# Patient Record
Sex: Female | Born: 1975 | Race: White | Hispanic: No | Smoking: Never smoker
Health system: Southern US, Community
[De-identification: ages and names within clinical notes are randomized; demographics above are authoritative.]

## PROBLEM LIST (undated history)

## (undated) DIAGNOSIS — T7840XA Allergy, unspecified, initial encounter: Secondary | ICD-10-CM

## (undated) DIAGNOSIS — K589 Irritable bowel syndrome without diarrhea: Secondary | ICD-10-CM

## (undated) DIAGNOSIS — K219 Gastro-esophageal reflux disease without esophagitis: Secondary | ICD-10-CM

## (undated) DIAGNOSIS — J45909 Unspecified asthma, uncomplicated: Secondary | ICD-10-CM

## (undated) DIAGNOSIS — F419 Anxiety disorder, unspecified: Secondary | ICD-10-CM

## (undated) DIAGNOSIS — B019 Varicella without complication: Secondary | ICD-10-CM

## (undated) HISTORY — DX: Gastro-esophageal reflux disease without esophagitis: K21.9

## (undated) HISTORY — DX: Allergy, unspecified, initial encounter: T78.40XA

## (undated) HISTORY — DX: Unspecified asthma, uncomplicated: J45.909

## (undated) HISTORY — DX: Varicella without complication: B01.9

## (undated) HISTORY — PX: FOOT SURGERY: SHX648

## (undated) HISTORY — DX: Anxiety disorder, unspecified: F41.9

## (undated) HISTORY — DX: Irritable bowel syndrome, unspecified: K58.9

---

## 2000-09-28 ENCOUNTER — Other Ambulatory Visit: Admission: RE | Admit: 2000-09-28 | Discharge: 2000-09-28 | Payer: Self-pay | Admitting: Obstetrics and Gynecology

## 2002-01-01 ENCOUNTER — Other Ambulatory Visit: Admission: RE | Admit: 2002-01-01 | Discharge: 2002-01-01 | Payer: Self-pay | Admitting: Obstetrics and Gynecology

## 2003-01-23 ENCOUNTER — Other Ambulatory Visit: Admission: RE | Admit: 2003-01-23 | Discharge: 2003-01-23 | Payer: Self-pay | Admitting: Obstetrics and Gynecology

## 2003-11-05 ENCOUNTER — Inpatient Hospital Stay (HOSPITAL_COMMUNITY): Admission: AD | Admit: 2003-11-05 | Discharge: 2003-11-05 | Payer: Self-pay | Admitting: Obstetrics and Gynecology

## 2003-12-03 ENCOUNTER — Inpatient Hospital Stay (HOSPITAL_COMMUNITY): Admission: AD | Admit: 2003-12-03 | Discharge: 2003-12-05 | Payer: Self-pay | Admitting: Obstetrics and Gynecology

## 2004-01-08 ENCOUNTER — Other Ambulatory Visit: Admission: RE | Admit: 2004-01-08 | Discharge: 2004-01-08 | Payer: Self-pay | Admitting: Obstetrics and Gynecology

## 2009-01-26 ENCOUNTER — Emergency Department (HOSPITAL_COMMUNITY): Admission: EM | Admit: 2009-01-26 | Discharge: 2009-01-26 | Payer: Self-pay | Admitting: Family Medicine

## 2011-10-04 DIAGNOSIS — K219 Gastro-esophageal reflux disease without esophagitis: Secondary | ICD-10-CM | POA: Insufficient documentation

## 2011-11-22 DIAGNOSIS — K589 Irritable bowel syndrome without diarrhea: Secondary | ICD-10-CM | POA: Insufficient documentation

## 2013-10-25 DIAGNOSIS — J189 Pneumonia, unspecified organism: Secondary | ICD-10-CM

## 2013-10-25 HISTORY — DX: Pneumonia, unspecified organism: J18.9

## 2015-06-19 ENCOUNTER — Encounter: Payer: Self-pay | Admitting: Family Medicine

## 2015-06-19 ENCOUNTER — Ambulatory Visit (INDEPENDENT_AMBULATORY_CARE_PROVIDER_SITE_OTHER): Payer: BLUE CROSS/BLUE SHIELD | Admitting: Family Medicine

## 2015-06-19 VITALS — BP 118/70 | HR 64 | Ht 66.0 in | Wt 109.0 lb

## 2015-06-19 DIAGNOSIS — M9908 Segmental and somatic dysfunction of rib cage: Secondary | ICD-10-CM

## 2015-06-19 DIAGNOSIS — M9902 Segmental and somatic dysfunction of thoracic region: Secondary | ICD-10-CM | POA: Diagnosis not present

## 2015-06-19 DIAGNOSIS — M9901 Segmental and somatic dysfunction of cervical region: Secondary | ICD-10-CM

## 2015-06-19 DIAGNOSIS — M940 Chondrocostal junction syndrome [Tietze]: Secondary | ICD-10-CM | POA: Diagnosis not present

## 2015-06-19 DIAGNOSIS — M999 Biomechanical lesion, unspecified: Secondary | ICD-10-CM | POA: Insufficient documentation

## 2015-06-19 NOTE — Progress Notes (Signed)
[name] [address] [place], [place] [address] Phone: [phone] Subjective:     CC: Right rib pain  BJY:NWGNFAOZHY Jaclyn [name] is a 39 y.o. female coming in with complaint of right rib pain. Patient is a past medical history significant for asthma. Patient did have another exacerbation and was having a significant cough. Patient was treated for pneumonia as well. Patient states though that since then she's been having pain on the right side. Patient has tried a [place] with no significant improvement. States sometimes it can catch her breath. Hurts with certain movements. Denies any numbness or tingling. Denies any fevers chills or any abnormal weight loss. Patient rates the severity of pain a 6 out of 10. Does respond anti-inflammatory patient is now when he uses regularly. Looking for a more concrete answer as well as what she can do. Not waking her up at night.     No past medical history on file. asthma No past surgical history on file. shoulder dislocation surgery Social History  Substance Use Topics  . Smoking status: Never Smoker   . Smokeless tobacco: None  . Alcohol Use: None   Social History   Social History  . Marital Status: Legally Separated    Spouse Name: N/A  . Number of Children: N/A  . Years of Education: N/A   Occupational History  . Not on file.   Social History Main Topics  . Smoking status: Never Smoker   . Smokeless tobacco: Not on file  . Alcohol Use: Not on file  . Drug Use: Not on file  . Sexual Activity: Not on file   Other Topics Concern  . Not on file   Social History Narrative  . No narrative on file   No family history on file.   Past medical history, social, surgical and family history all reviewed in electronic medical record.   Review of Systems: No headache, visual changes, nausea, vomiting, diarrhea, constipation, dizziness, abdominal pain, skin rash, fevers, chills, night sweats,  weight loss, swollen lymph nodes, body aches, joint swelling, muscle aches, chest pain, shortness of breath, mood changes.   Objective Blood pressure 118/70, pulse 64, height 5\' 6"  (1.676 m), weight 109 lb (49.442 kg), SpO2 97 %.  General: No apparent distress alert and oriented x3 mood and affect normal, dressed appropriately.  HEENT: Pupils equal, extraocular movements intact  Respiratory: Patient's speak in full sentences and does not appear short of breath  Cardiovascular: No lower extremity edema, non tender, no erythema  Skin: Warm dry intact with no signs of infection or rash on extremities or on axial skeleton.  Abdomen: Soft nontender  Neuro: Cranial nerves II through XII are intact, neurovascularly intact in all extremities with 2+ DTRs and 2+ pulses.  Lymph: No lymphadenopathy of posterior or anterior cervical chain or axillae bilaterally.  Gait normal with good balance and coordination.  MSK:  Non tender with full range of motion and good stability and symmetric strength and tone of shoulders, elbows, wrist, hip, knee and ankles bilaterally.  Back Exam:  Inspection: Unremarkable  Motion: Flexion 45 deg, Extension 45 deg, Side Bending to 45 deg bilaterally,  Rotation to 45 deg bilaterally  SLR laying: Negative  XSLR laying: Negative  Palpable tenderness: Tender over the fifth rib on the right side FABER: negative. Sensory change: Gross sensation intact to all lumbar and sacral dermatomes.  Reflexes: 2+ at both patellar tendons, 2+ at achilles tendons, Babinski's downgoing.  Strength at foot  Plantar-flexion: 5/5 Dorsi-flexion: 5/5 Eversion: 5/5 Inversion: 5/5  Leg strength  Quad: 5/5 Hamstring: 5/5 Hip flexor: 5/5 Hip abductors: 5/5  Gait unremarkable.  Osteopathic findings C4 flexed rotated and side bent left T5 extended rotated and side bent right with inhaled fifth rib L2 flexed rotated and side bent left  Procedure note 97110; 15 minutes spent for Therapeutic  exercises as stated in above notes.  This included exercises focusing on stretching, strengthening, with significant focus on eccentric aspects.Basic scapular stabilization to include adduction and depression of scapula Scaption, focusing on proper movement and good control Internal and External rotation utilizing a theraband, with elbow tucked at side entire time Rows with theraband   Proper technique shown and discussed handout in great detail with ATC.  All questions were discussed and answered.      Impression and Recommendations:     This case required medical decision making of moderate complexity.

## 2015-06-19 NOTE — Progress Notes (Signed)
Pre visit review using our clinic review tool, if applicable. No additional management support is needed unless otherwise documented below in the visit note. 

## 2015-06-19 NOTE — Assessment & Plan Note (Signed)
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 We discussed home exercises, scapular stabilization, icing put a call, and over-the-counter natural supplementations. We discussed which activities to do an proper lifting mechanics. Patient will try to make these different changes and come back in 3-4 weeks for further evaluation. Imaging is not necessary at this time.

## 2015-06-19 NOTE — Patient Instructions (Addendum)
Good to see you.  Ice 20 minutes 2 times daily. Usually after activity and before bed. Exercises 3 times a week. Focus on scapula stabilization Vitamin D 2000 IU daily Consider turmeric  twice daily Tennis ball to area and lay on it can take a weight and localize force and sometimes get it to pop back in.  Proper lifting techniques even your coffee pennsaid pinkie amount topically 2 times daily as needed.  See me again in 3 weeks.   Standing:  Secure a rubber exercise band/tubing so that it is at the height of your shoulders when you are either standing or sitting on a firm arm-less chair.  Grasp an end of the band/tubing in each hand and have your palms face each other. Straighten your elbows and lift your hands straight in front of you at shoulder height. Step back away from the secured end of band/tubing until it becomes tense.  Squeeze your shoulder blades together. Keeping your elbows locked and your hands at shoulder-height, bring your hands out to your side.  Hold __________ seconds. Slowly ease the tension on the band/tubing as you reverse the directions and return to the starting position. Repeat __________ times. Complete this exercise __________ times per day. STRENGTH - Scapular Retractors  Secure a rubber exercise band/tubing so that it is at the height of your shoulders when you are either standing or sitting on a firm arm-less chair.  With a palm-down grip, grasp an end of the band/tubing in each hand. Straighten your elbows and lift your hands straight in front of you at shoulder height. Step back away from the secured end of band/tubing until it becomes tense.  Squeezing your shoulder blades together, draw your elbows back as you bend them. Keep your upper arm lifted away from your body throughout the exercise.  Hold __________ seconds. Slowly ease the tension on the band/tubing as you reverse the directions and return to the starting position. Repeat __________  times. Complete this exercise __________ times per day. STRENGTH - Shoulder Extensors   Secure a rubber exercise band/tubing so that it is at the height of your shoulders when you are either standing or sitting on a firm arm-less chair.  With a thumbs-up grip, grasp an end of the band/tubing in each hand. Straighten your elbows and lift your hands straight in front of you at shoulder height. Step back away from the secured end of band/tubing until it becomes tense.  Squeezing your shoulder blades together, pull your hands down to the sides of your thighs. Do not allow your hands to go behind you.  Hold for __________ seconds. Slowly ease the tension on the band/tubing as you reverse the directions and return to the starting position. Repeat __________ times. Complete this exercise __________ times per day.  STRENGTH - Scapular Retractors and External Rotators  Secure a rubber exercise band/tubing so that it is at the height of your shoulders when you are either standing or sitting on a firm arm-less chair.  With a palm-down grip, grasp an end of the band/tubing in each hand. Bend your elbows 90 degrees and lift your elbows to shoulder height at your sides. Step back away from the secured end of band/tubing until it becomes tense.  Squeezing your shoulder blades together, rotate your shoulder so that your upper arm and elbow remain stationary, but your fists travel upward to head-height.  Hold __________ for seconds. Slowly ease the tension on the band/tubing as you reverse the directions and return  to the starting position. Repeat __________ times. Complete this exercise __________ times per day.  STRENGTH - Scapular Retractors and External Rotators, Rowing  Secure a rubber exercise band/tubing so that it is at the height of your shoulders when you are either standing or sitting on a firm arm-less chair.  With a palm-down grip, grasp an end of the band/tubing in each hand. Straighten your  elbows and lift your hands straight in front of you at shoulder height. Step back away from the secured end of band/tubing until it becomes tense.  Step 1: Squeeze your shoulder blades together. Bending your elbows, draw your hands to your chest as if you are rowing a boat. At the end of this motion, your hands and elbow should be at shoulder-height and your elbows should be out to your sides.  Step 2: Rotate your shoulder to raise your hands above your head. Your forearms should be vertical and your upper-arms should be horizontal.  Hold for __________ seconds. Slowly ease the tension on the band/tubing as you reverse the directions and return to the starting position. Repeat __________ times. Complete this exercise __________ times per day.  STRENGTH - Scapular Retractors and Elevators  Secure a rubber exercise band/tubing so that it is at the height of your shoulders when you are either standing or sitting on a firm arm-less chair.  With a thumbs-up grip, grasp an end of the band/tubing in each hand. Step back away from the secured end of band/tubing until it becomes tense.  Squeezing your shoulder blades together, straighten your elbows and lift your hands straight over your head.  Hold for __________ seconds. Slowly ease the tension on the band/tubing as you reverse the directions and return to the starting position. Repeat __________ times. Complete this exercise __________ times per day.  Document Released: 10/11/2005 Document Revised: 01/03/2012 Document Reviewed: 01/23/2009 [facility] Patient Information 2015 [place], [place]. This information is not intended to replace advice given to you by your health care provider. Make sure you discuss any questions you have with your health care provider.

## 2015-06-19 NOTE — Assessment & Plan Note (Signed)
Decision today to treat with OMT was based on Physical Exam  After verbal consent patient was treated with HVLA, ME, FPR techniques in cervical, thoracic and rib areas  Patient tolerated the procedure well with improvement in symptoms  Patient given exercises, stretches and lifestyle modifications  See medications in patient instructions if given  Patient will follow up in 3 weeks  

## 2015-06-24 ENCOUNTER — Telehealth: Payer: Self-pay | Admitting: Family Medicine

## 2015-06-24 ENCOUNTER — Telehealth: Payer: Self-pay

## 2015-06-24 NOTE — Telephone Encounter (Signed)
Patient has some questions about the exercises that was given to her  Please call her at [phone] after 2:45

## 2015-06-24 NOTE — Telephone Encounter (Signed)
Spoke with patient regarding limitations, wanted to start doing basic pelvic floor activation and walking and wanted to make sure they should be ok

## 2015-07-10 ENCOUNTER — Ambulatory Visit (INDEPENDENT_AMBULATORY_CARE_PROVIDER_SITE_OTHER): Payer: BLUE CROSS/BLUE SHIELD | Admitting: Family Medicine

## 2015-07-10 ENCOUNTER — Encounter: Payer: Self-pay | Admitting: Family Medicine

## 2015-07-10 ENCOUNTER — Other Ambulatory Visit (INDEPENDENT_AMBULATORY_CARE_PROVIDER_SITE_OTHER): Payer: BLUE CROSS/BLUE SHIELD

## 2015-07-10 VITALS — BP 100/62 | HR 66 | Ht 66.0 in | Wt 108.0 lb

## 2015-07-10 DIAGNOSIS — M9908 Segmental and somatic dysfunction of rib cage: Secondary | ICD-10-CM | POA: Diagnosis not present

## 2015-07-10 DIAGNOSIS — M999 Biomechanical lesion, unspecified: Secondary | ICD-10-CM

## 2015-07-10 DIAGNOSIS — M9902 Segmental and somatic dysfunction of thoracic region: Secondary | ICD-10-CM

## 2015-07-10 DIAGNOSIS — M25511 Pain in right shoulder: Secondary | ICD-10-CM

## 2015-07-10 DIAGNOSIS — M9901 Segmental and somatic dysfunction of cervical region: Secondary | ICD-10-CM | POA: Diagnosis not present

## 2015-07-10 DIAGNOSIS — M940 Chondrocostal junction syndrome [Tietze]: Secondary | ICD-10-CM | POA: Diagnosis not present

## 2015-07-10 MED ORDER — NITROGLYCERIN 0.2 MG/HR TD PT24: MEDICATED_PATCH | TRANSDERMAL | Status: DC

## 2015-07-10 NOTE — Patient Instructions (Addendum)
Good to see you Try the exercises until you have pain but stop if you have pain Try the pelvis rehab.  Nitroglycerin Protocol   Apply 1/4 nitroglycerin patch to affected area daily.  Change position of patch within the affected area every 24 hours.  You may experience a headache during the first 1-2 weeks of using the patch, these should subside.  If you experience headaches after beginning nitroglycerin patch treatment, you may take your preferred over the counter pain reliever.  Another side effect of the nitroglycerin patch is skin irritation or rash related to patch adhesive.  Please notify our office if you develop more severe headaches or rash, and stop the patch.  Tendon healing with nitroglycerin patch may require 12 to 24 weeks depending on the extent of injury.  Men should not use if taking Viagra, Cialis, or Levitra.   Do not use if you have migraines or rosacea.   Continue the ice Continue the vitamins but increase vitamin D to 4000 IU daily for next 2 weeks See me again in 4 weeks.

## 2015-07-10 NOTE — Progress Notes (Signed)
Pre visit review using our clinic review tool, if applicable. No additional management support is needed unless otherwise documented below in the visit note. 

## 2015-07-10 NOTE — Assessment & Plan Note (Signed)
Patient is healing but it is very slow. I do think that there is some scar tissue formation and patient elected to try something more aggressive and will try and nitroglycerin. Warned of potential side effects. Patient will stop it if she has any of the side effects. We discussed icing regimen and home exercises. Patient continue work on the postural control. Patient come back and see me again in 4-6 weeks for further evaluation and treatment.

## 2015-07-10 NOTE — Progress Notes (Signed)
[name] [address] [place], [place] [address] Phone: [phone] Subjective:     CC: Right rib pain follow-up  BJY:NWGNFAOZHY Jaclyn [name] is a 39 y.o. female coming in with complaint of right rib pain. 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 Patient didn't tolerate osteopathic manipulation and given home exercises working on scapular stabilization and upper back musculature. Patient was to do some over-the-counter natural supplementation. Patient was also given an icing protocol. Patient states she is doing better but still has some tightness in the area. Patient feels that the rib stated for a long amount of time but unfortunately feels like she has more scarring sensation on the anterior part of the rib. Patient states is not stopping her from any significant activity but still not lifting. States that some of the exercises causes pain.  No numbness or weakness. Overall would state that she's 50-60% better.      Past Medical History  Diagnosis Date  . Asthma   . Chicken pox   . GERD (gastroesophageal reflux disease)   . Allergy    asthma History reviewed. No pertinent past surgical history. shoulder dislocation surgery Social History  Substance Use Topics  . Smoking status: Never Smoker   . Smokeless tobacco: Never Used  . Alcohol Use: No   Social History   Social History  . Marital Status: Legally Separated    Spouse Name: N/A  . Number of Children: N/A  . Years of Education: N/A   Occupational History  . Not on file.   Social History Main Topics  . Smoking status: Never Smoker   . Smokeless tobacco: Never Used  . Alcohol Use: No  . Drug Use: No  . Sexual Activity: Not on file   Other Topics Concern  . Not on file   Social History Narrative   Family History  Problem Relation Age of Onset  . Alcohol abuse Mother   . Cancer Mother   . Heart disease Mother   . Alcohol abuse Father   . Heart disease  Father      Past medical history, social, surgical and family history all reviewed in electronic medical record.   Review of Systems: No headache, visual changes, nausea, vomiting, diarrhea, constipation, dizziness, abdominal pain, skin rash, fevers, chills, night sweats, weight loss, swollen lymph nodes, body aches, joint swelling, muscle aches, chest pain, shortness of breath, mood changes.   Objective Blood pressure 100/62, pulse 66, height  (1.676 m), weight 108 lb (48.988 kg), SpO2 98 %.  General: No apparent distress alert and oriented x3 mood and affect normal, dressed appropriately.  HEENT: Pupils equal, extraocular movements intact  Respiratory: Patient's speak in full sentences and does not appear short of breath  Cardiovascular: No lower extremity edema, non tender, no erythema  Skin: Warm dry intact with no signs of infection or rash on extremities or on axial skeleton.  Abdomen: Soft nontender  Neuro: Cranial nerves II through XII are intact, neurovascularly intact in all extremities with 2+ DTRs and 2+ pulses.  Lymph: No lymphadenopathy of posterior or anterior cervical chain or axillae bilaterally.  Gait normal with good balance and coordination.  MSK:  Non tender with full range of motion and good stability and symmetric strength and tone of shoulders, elbows, wrist, hip, knee and ankles bilaterally.  Back Exam:  Inspection: Unremarkable  Motion: Flexion 45 deg, Extension 45 deg, Side Bending to 45 deg bilaterally,  Rotation to  45 deg bilaterally  SLR laying: Negative  XSLR laying: Negative  Palpable tenderness: Tender over the fifth rib on the right side still present FABER: negative. Sensory change: Gross sensation intact to all lumbar and sacral dermatomes.  Reflexes: 2+ at both patellar tendons, 2+ at achilles tendons, Babinski's downgoing.  Strength at foot  Plantar-flexion: 5/5 Dorsi-flexion: 5/5 Eversion: 5/5 Inversion: 5/5  Leg strength  Quad: 5/5  Hamstring: 5/5 Hip flexor: 5/5 Hip abductors: 4/5  Gait unremarkable.  Osteopathic findings C4 flexed rotated and side bent left T5 extended rotated and side bent right with inhaled fifth rib L2 flexed rotated and side bent left L4 flexed rotated and side bent right       Impression and Recommendations:     This case required medical decision making of moderate complexity.

## 2015-07-10 NOTE — Assessment & Plan Note (Signed)
Decision today to treat with OMT was based on Physical Exam  After verbal consent patient was treated with HVLA, ME, FPR techniques in cervical, thoracic and rib areas  Patient tolerated the procedure well with improvement in symptoms  Patient given exercises, stretches and lifestyle modifications  See medications in patient instructions if given  Patient will follow up in 4-6 weeks    

## 2015-08-06 ENCOUNTER — Ambulatory Visit (INDEPENDENT_AMBULATORY_CARE_PROVIDER_SITE_OTHER): Payer: BLUE CROSS/BLUE SHIELD | Admitting: Family Medicine

## 2015-08-06 ENCOUNTER — Encounter: Payer: Self-pay | Admitting: Family Medicine

## 2015-08-06 VITALS — BP 112/70 | HR 64 | Ht 66.0 in | Wt 107.0 lb

## 2015-08-06 DIAGNOSIS — M940 Chondrocostal junction syndrome [Tietze]: Secondary | ICD-10-CM

## 2015-08-06 DIAGNOSIS — M9902 Segmental and somatic dysfunction of thoracic region: Secondary | ICD-10-CM | POA: Diagnosis not present

## 2015-08-06 DIAGNOSIS — M9908 Segmental and somatic dysfunction of rib cage: Secondary | ICD-10-CM

## 2015-08-06 DIAGNOSIS — M9901 Segmental and somatic dysfunction of cervical region: Secondary | ICD-10-CM

## 2015-08-06 DIAGNOSIS — M999 Biomechanical lesion, unspecified: Secondary | ICD-10-CM

## 2015-08-06 NOTE — Assessment & Plan Note (Signed)
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 We discussed with patient about the importance is of posterior. We discussed icing regimen. We discussed which activities to do an which ones to potentially avoid. Patient will continue to monitor. If any worsening symptoms she'll call otherwise will do 5-6 weeks for further evaluation and treatment.

## 2015-08-06 NOTE — Patient Instructions (Addendum)
Happy early birthday! Ice is your friend Try to at least do the posture exercises (on wall etc) Stay active Make sure you take time for your self Protein!!!!  100-120 grams daily (my fitness pal) See me again in 4-6 weeks.

## 2015-08-06 NOTE — Progress Notes (Signed)
[name] [address] [place], [place] [address] Phone: [phone] Subjective:     CC: Right rib pain follow-up  BJY:NWGNFAOZHY Jaclyn [name] is a 39 y.o. female coming in with complaint of right rib pain. 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 Patient continues to respond well to osteopathic manipulation as well as working on the scapular dysfunction. Patient was still having some pains we started her on the nitroglycerin patches. Patient states overall she continues to improve very slowly. Patient states that she notices she still gets fatigued and has pain the next day after doing a lot of activity. Patient denies though any worsening of the symptoms. No significant side effects to the nitroglycerin and does feel that it is helping. Patient states that she is probably 85% better since we started this process.      Past Medical History  Diagnosis Date  . Asthma   . Chicken pox   . GERD (gastroesophageal reflux disease)   . Allergy    asthma No past surgical history on file. shoulder dislocation surgery Social History  Substance Use Topics  . Smoking status: Never Smoker   . Smokeless tobacco: Never Used  . Alcohol Use: No   Social History   Social History  . Marital Status: Legally Separated    Spouse Name: N/A  . Number of Children: N/A  . Years of Education: N/A   Occupational History  . Not on file.   Social History Main Topics  . Smoking status: Never Smoker   . Smokeless tobacco: Never Used  . Alcohol Use: No  . Drug Use: No  . Sexual Activity: Not on file   Other Topics Concern  . Not on file   Social History Narrative   Family History  Problem Relation Age of Onset  . Alcohol abuse Mother   . Cancer Mother   . Heart disease Mother   . Alcohol abuse Father   . Heart disease Father      Past medical history, social, surgical and family history all reviewed in electronic medical record.    Review of Systems: No headache, visual changes, nausea, vomiting, diarrhea, constipation, dizziness, abdominal pain, skin rash, fevers, chills, night sweats, weight loss, swollen lymph nodes, body aches, joint swelling, muscle aches, chest pain, shortness of breath, mood changes.   Objective Blood pressure 112/70, pulse 64, height  (1.676 m), weight 107 lb (48.535 kg), SpO2 99 %.  General: No apparent distress alert and oriented x3 mood and affect normal, dressed appropriately.  HEENT: Pupils equal, extraocular movements intact  Respiratory: Patient's speak in full sentences and does not appear short of breath  Cardiovascular: No lower extremity edema, non tender, no erythema  Skin: Warm dry intact with no signs of infection or rash on extremities or on axial skeleton.  Abdomen: Soft nontender  Neuro: Cranial nerves II through XII are intact, neurovascularly intact in all extremities with 2+ DTRs and 2+ pulses.  Lymph: No lymphadenopathy of posterior or anterior cervical chain or axillae bilaterally.  Gait normal with good balance and coordination.  MSK:  Non tender with full range of motion and good stability and symmetric strength and tone of shoulders, elbows, wrist, hip, knee and ankles bilaterally.  Back Exam:  Inspection: Unremarkable  Motion: Flexion 45 deg, Extension 45 deg, Side Bending to 45 deg bilaterally,  Rotation to 45 deg bilaterally  SLR laying: Negative  XSLR laying: Negative  Palpable tenderness: Mild  tenderness over the fifth rib but improving FABER: negative. Sensory change: Gross sensation intact to all lumbar and sacral dermatomes.  Reflexes: 2+ at both patellar tendons, 2+ at achilles tendons, Babinski's downgoing.  Strength at foot  Plantar-flexion: 5/5 Dorsi-flexion: 5/5 Eversion: 5/5 Inversion: 5/5  Leg strength  Quad: 5/5 Hamstring: 5/5 Hip flexor: 5/5 Hip abductors: 4/5  Gait unremarkable.  Osteopathic findings C4 flexed rotated and side bent  left T5 extended rotated and side bent right with inhaled fifth rib L2 flexed rotated and side bent left L4 flexed rotated and side bent right Same pattern as previous      Impression and Recommendations:     This case required medical decision making of moderate complexity.

## 2015-08-06 NOTE — Progress Notes (Signed)
Pre visit review using our clinic review tool, if applicable. No additional management support is needed unless otherwise documented below in the visit note. 

## 2015-08-06 NOTE — Assessment & Plan Note (Signed)
Decision today to treat with OMT was based on Physical Exam  After verbal consent patient was treated with HVLA, ME, FPR techniques in cervical, thoracic and rib areas  Patient tolerated the procedure well with improvement in symptoms  Patient given exercises, stretches and lifestyle modifications  See medications in patient instructions if given  Patient will follow up in 4-6 weeks    

## 2015-08-29 DIAGNOSIS — B370 Candidal stomatitis: Secondary | ICD-10-CM | POA: Insufficient documentation

## 2015-09-04 ENCOUNTER — Encounter: Payer: Self-pay | Admitting: Family Medicine

## 2015-09-04 ENCOUNTER — Ambulatory Visit (INDEPENDENT_AMBULATORY_CARE_PROVIDER_SITE_OTHER): Payer: BLUE CROSS/BLUE SHIELD | Admitting: Family Medicine

## 2015-09-04 VITALS — BP 104/68 | HR 88 | Wt 109.0 lb

## 2015-09-04 DIAGNOSIS — M940 Chondrocostal junction syndrome [Tietze]: Secondary | ICD-10-CM

## 2015-09-04 DIAGNOSIS — M999 Biomechanical lesion, unspecified: Secondary | ICD-10-CM

## 2015-09-04 DIAGNOSIS — M9908 Segmental and somatic dysfunction of rib cage: Secondary | ICD-10-CM | POA: Diagnosis not present

## 2015-09-04 DIAGNOSIS — M9902 Segmental and somatic dysfunction of thoracic region: Secondary | ICD-10-CM

## 2015-09-04 DIAGNOSIS — M9901 Segmental and somatic dysfunction of cervical region: Secondary | ICD-10-CM

## 2015-09-04 NOTE — Assessment & Plan Note (Signed)
Decision today to treat with OMT was based on Physical Exam  After verbal consent patient was treated with HVLA, ME, FPR techniques in cervical, thoracic and rib areas  Patient tolerated the procedure well with improvement in symptoms  Patient given exercises, stretches and lifestyle modifications  See medications in patient instructions if given  Patient will follow up in 4-6 weeks    

## 2015-09-04 NOTE — Assessment & Plan Note (Signed)
Patient is doing very well at this time. Encourage her to continue the same regimen at this time. No specific changes. Discuss that she is going to be running on a more regular basis to make sure she is stretching her hip flexor. Patient will come back and see me again in 6 weeks for further evaluation and treatment.

## 2015-09-04 NOTE — Progress Notes (Signed)
Pre visit review using our clinic review tool, if applicable. No additional management support is needed unless otherwise documented below in the visit note. 

## 2015-09-04 NOTE — Patient Instructions (Signed)
Good to see you Ice is your friend Stay active stretch the hip flexor.  See you in 6 weeks.

## 2015-09-04 NOTE — Progress Notes (Signed)
[name] [address] [place], [place] [address] Phone: [phone] Subjective:     CC: Right rib pain follow-up  [id number] Jaclyn [name] is a 39 y.o. female coming in with complaint of right rib pain. 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 Patient has been doing markedly better at this time. Patient is even started a running regimen. States that the first couple time she did have severe pain but since then has been increasing her running. Patient denies any worsening of symptoms. Denies any new symptoms. States that she is hoping to get back into a regular workout regimen. Patient is also doing yoga on a regular basis. Was to do the nitroglycerin but has discontinued this secondary to feeling fatigued.     Past Medical History  Diagnosis Date  . Asthma   . Chicken pox   . GERD (gastroesophageal reflux disease)   . Allergy    asthma No past surgical history on file. shoulder dislocation surgery Social History  Substance Use Topics  . Smoking status: Never Smoker   . Smokeless tobacco: Never Used  . Alcohol Use: No   Social History   Social History  . Marital Status: Legally Separated    Spouse Name: N/A  . Number of Children: N/A  . Years of Education: N/A   Occupational History  . Not on file.   Social History Main Topics  . Smoking status: Never Smoker   . Smokeless tobacco: Never Used  . Alcohol Use: No  . Drug Use: No  . Sexual Activity: Not on file   Other Topics Concern  . Not on file   Social History Narrative   Family History  Problem Relation Age of Onset  . Alcohol abuse Mother   . Cancer Mother   . Heart disease Mother   . Alcohol abuse Father   . Heart disease Father      Past medical history, social, surgical and family history all reviewed in electronic medical record.   Review of Systems: No headache, visual changes, nausea, vomiting, diarrhea, constipation, dizziness, abdominal  pain, skin rash, fevers, chills, night sweats, weight loss, swollen lymph nodes, body aches, joint swelling, muscle aches, chest pain, shortness of breath, mood changes.   Objective Blood pressure 104/68, pulse 88, weight 109 lb (49.442 kg), SpO2 98 %.  General: No apparent distress alert and oriented x3 mood and affect normal, dressed appropriately.  HEENT: Pupils equal, extraocular movements intact  Respiratory: Patient's speak in full sentences and does not appear short of breath  Cardiovascular: No lower extremity edema, non tender, no erythema  Skin: Warm dry intact with no signs of infection or rash on extremities or on axial skeleton.  Abdomen: Soft nontender  Neuro: Cranial nerves II through XII are intact, neurovascularly intact in all extremities with 2+ DTRs and 2+ pulses.  Lymph: No lymphadenopathy of posterior or anterior cervical chain or axillae bilaterally.  Gait normal with good balance and coordination.  MSK:  Non tender with full range of motion and good stability and symmetric strength and tone of shoulders, elbows, wrist, hip, knee and ankles bilaterally.  Back Exam:  Inspection: Unremarkable  Motion: Flexion 45 deg, Extension 45 deg, Side Bending to 45 deg bilaterally,  Rotation to 45 deg bilaterally  SLR laying: Negative  XSLR laying: Negative  Palpable tenderness: Minimal tenderness of the thoracic spine but improved from previous exam FABER: negative. Sensory change: Gross sensation intact to all lumbar  and sacral dermatomes.  Reflexes: 2+ at both patellar tendons, 2+ at achilles tendons, Babinski's downgoing.  Strength at foot  Plantar-flexion: 5/5 Dorsi-flexion: 5/5 Eversion: 5/5 Inversion: 5/5  Leg strength  Quad: 5/5 Hamstring: 5/5 Hip flexor: 5/5 Hip abductors: 4/5  Gait unremarkable.  Osteopathic findings C4 flexed rotated and side bent left T5 extended rotated and side bent right with inhaled fifth rib L2 flexed rotated and side bent left L4 flexed  rotated and side bent right Sacrum left on left      Impression and Recommendations:     This case required medical decision making of moderate complexity.

## 2015-10-16 ENCOUNTER — Encounter: Payer: Self-pay | Admitting: Family Medicine

## 2015-10-16 ENCOUNTER — Ambulatory Visit (INDEPENDENT_AMBULATORY_CARE_PROVIDER_SITE_OTHER): Payer: BLUE CROSS/BLUE SHIELD | Admitting: Family Medicine

## 2015-10-16 VITALS — BP 118/62 | HR 68 | Ht 66.0 in | Wt 109.0 lb

## 2015-10-16 DIAGNOSIS — M940 Chondrocostal junction syndrome [Tietze]: Secondary | ICD-10-CM | POA: Diagnosis not present

## 2015-10-16 DIAGNOSIS — M999 Biomechanical lesion, unspecified: Secondary | ICD-10-CM

## 2015-10-16 DIAGNOSIS — M9908 Segmental and somatic dysfunction of rib cage: Secondary | ICD-10-CM

## 2015-10-16 DIAGNOSIS — M9901 Segmental and somatic dysfunction of cervical region: Secondary | ICD-10-CM | POA: Diagnosis not present

## 2015-10-16 DIAGNOSIS — M9902 Segmental and somatic dysfunction of thoracic region: Secondary | ICD-10-CM | POA: Diagnosis not present

## 2015-10-16 NOTE — Patient Instructions (Signed)
Great to see you Lactobacillus would be good to add to probiotic.  Get back in the gym and hit the yoga mat! Continue everything else you are doing you are making great strides.  [employer] "total support" on [employer] would be great for the feet See me again in 6-8 weeks.

## 2015-10-16 NOTE — Assessment & Plan Note (Signed)
Overall I do think the patient is making some improvement. We discussed how patient can motivated to do the exercises more regularly as well as increase her activity. We also discussed protein supplementation. Patient had difficulty with continuing to be active and continuing to increase activity. Patient will do some positive reinforcement visual come back and see me again 6-8 weeks for further evaluation and treatment

## 2015-10-16 NOTE — Assessment & Plan Note (Signed)
Decision today to treat with OMT was based on Physical Exam  After verbal consent patient was treated with HVLA, ME, FPR techniques in cervical, thoracic and rib areas  Patient tolerated the procedure well with improvement in symptoms  Patient given exercises, stretches and lifestyle modifications  See medications in patient instructions if given  Patient will follow up in 6-8 weeks                  

## 2015-10-16 NOTE — Progress Notes (Signed)
Pre visit review using our clinic review tool, if applicable. No additional management support is needed unless otherwise documented below in the visit note. 

## 2015-10-16 NOTE — Progress Notes (Signed)
[name] [address] [place], [place] [address] Phone: [phone] Subjective:     CC: Right rib pain follow-up  [id number] Jaclyn [name] is a 39 y.o. female coming in with complaint of right rib pain. 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 Continues to improve. Patient notices when she does the exercises on a regular basis she is doing much better. Patient describes the pain as a dull, throbbing aching sensation. Nothing that is patient states that after a long day of doing massages she can feel some discomfort. Patient is not motivated to start working out on a regular basis yet.     Past Medical History  Diagnosis Date  . Asthma   . Chicken pox   . GERD (gastroesophageal reflux disease)   . Allergy    asthma No past surgical history on file. shoulder dislocation surgery Social History  Substance Use Topics  . Smoking status: Never Smoker   . Smokeless tobacco: Never Used  . Alcohol Use: No   Social History   Social History  . Marital Status: Legally Separated    Spouse Name: N/A  . Number of Children: N/A  . Years of Education: N/A   Occupational History  . Not on file.   Social History Main Topics  . Smoking status: Never Smoker   . Smokeless tobacco: Never Used  . Alcohol Use: No  . Drug Use: No  . Sexual Activity: Not on file   Other Topics Concern  . Not on file   Social History Narrative   Family History  Problem Relation Age of Onset  . Alcohol abuse Mother   . Cancer Mother   . Heart disease Mother   . Alcohol abuse Father   . Heart disease Father      Past medical history, social, surgical and family history all reviewed in electronic medical record.   Review of Systems: No headache, visual changes, nausea, vomiting, diarrhea, constipation, dizziness, abdominal pain, skin rash, fevers, chills, night sweats, weight loss, swollen lymph nodes, body aches, joint swelling, muscle aches,  chest pain, shortness of breath, mood changes.   Objective Blood pressure 118/62, pulse 68, height  (1.676 m), weight 109 lb (49.442 kg), SpO2 99 %.  General: No apparent distress alert and oriented x3 mood and affect normal, dressed appropriately.  HEENT: Pupils equal, extraocular movements intact  Respiratory: Patient's speak in full sentences and does not appear short of breath  Cardiovascular: No lower extremity edema, non tender, no erythema  Skin: Warm dry intact with no signs of infection or rash on extremities or on axial skeleton.  Abdomen: Soft nontender  Neuro: Cranial nerves II through XII are intact, neurovascularly intact in all extremities with 2+ DTRs and 2+ pulses.  Lymph: No lymphadenopathy of posterior or anterior cervical chain or axillae bilaterally.  Gait normal with good balance and coordination.  MSK:  Non tender with full range of motion and good stability and symmetric strength and tone of shoulders, elbows, wrist, hip, knee and ankles bilaterally.  Back Exam:  Inspection: Unremarkable  Motion: Flexion 45 deg, Extension 45 deg, Side Bending to 45 deg bilaterally,  Rotation to 45 deg bilaterally  SLR laying: Negative  XSLR laying: Negative  Palpable tenderness: Continued improvement with decreasing tenderness over the thoracic spine. FABER: negative. Sensory change: Gross sensation intact to all lumbar and sacral dermatomes.  Reflexes: 2+ at both patellar tendons, 2+ at achilles tendons, Babinski's downgoing.  Strength at foot  Plantar-flexion: 5/5 Dorsi-flexion: 5/5 Eversion: 5/5 Inversion: 5/5  Leg strength  Quad: 5/5 Hamstring: 5/5 Hip flexor: 5/5 Hip abductors: 4/5 with no improvement and possibly mild increasing weakness Gait unremarkable.  Osteopathic findings C2 flexed rotated and side bent right C4 flexed rotated and side bent left T5 extended rotated and side bent right with inhaled fifth rib T8 extended rotated and side bent left L2 flexed  rotated and side bent left L4 flexed rotated and side bent right Sacrum left on left      Impression and Recommendations:     This case required medical decision making of moderate complexity.

## 2016-05-28 ENCOUNTER — Telehealth: Payer: Self-pay

## 2016-05-28 NOTE — Telephone Encounter (Signed)
We got a referral for this patient to see Dr. [name]. I have called 3 times and have got no answer. I left 2 VMS for patient to call back and make an app.

## 2016-05-31 NOTE — Telephone Encounter (Signed)
Noted  

## 2016-09-02 ENCOUNTER — Ambulatory Visit: Payer: BLUE CROSS/BLUE SHIELD | Admitting: Family Medicine

## 2016-12-23 NOTE — Progress Notes (Signed)
[name] [address] [place], [place] [address] Phone: [phone] Subjective:     CC: Right rib pain follow-up  [id number]  Jaclyn [name] is a 41 y.o. female coming in with complaint of right rib pain. 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 Patient has not been seen for 2 years. Has been doing relatively well. States that she is having some mild increase in discomfort recently. Patient states that there is more soreness of the upper back. Has been biking on a more regular basis. Patient denies any new symptoms is worsening a previous symptoms.     Past Medical History:  Diagnosis Date  . Allergy   . Asthma   . Chicken pox   . GERD (gastroesophageal reflux disease)    asthma No past surgical history on file. shoulder dislocation surgery Social History  Substance Use Topics  . Smoking status: Never Smoker  . Smokeless tobacco: Never Used  . Alcohol use No   Social History   Social History  . Marital status: Legally Separated    Spouse name: N/A  . Number of children: N/A  . Years of education: N/A   Occupational History  . Not on file.   Social History Main Topics  . Smoking status: Never Smoker  . Smokeless tobacco: Never Used  . Alcohol use No  . Drug use: No  . Sexual activity: Not on file   Other Topics Concern  . Not on file   Social History Narrative  . No narrative on file   Family History  Problem Relation Age of Onset  . Alcohol abuse Mother   . Cancer Mother   . Heart disease Mother   . Alcohol abuse Father   . Heart disease Father      Past medical history, social, surgical and family history all reviewed in electronic medical record.  Review of Systems: No headache, visual changes, nausea, vomiting, diarrhea, constipation, dizziness, abdominal pain, skin rash, fevers, chills, night sweats, weight loss, swollen lymph nodes, body aches, joint swelling, muscle aches, chest pain, shortness  of breath, mood changes.    Objective  There were no vitals taken for this visit.  Systems examined below as of 12/24/16 General: NAD A&O x3 mood, affect normal  HEENT: Pupils equal, extraocular movements intact no nystagmus Respiratory: not short of breath at rest or with speaking Cardiovascular: No lower extremity edema, non tender Skin: Warm dry intact with no signs of infection or rash on extremities or on axial skeleton. Abdomen: Soft nontender, no masses Neuro: Cranial nerves  intact, neurovascularly intact in all extremities with 2+ DTRs and 2+ pulses. Lymph: No lymphadenopathy appreciated today  Gait normal with good balance and coordination.  MSK: Non tender with full range of motion and good stability and symmetric strength and tone of shoulders, elbows, wrist,  knee hips and ankles bilaterally.   Back Exam:  Inspection: Unremarkable very small amount of scoliosis noted Motion: Flexion 45 deg, Extension 25 deg, Side Bending to 45 deg bilaterally,  Rotation to 45 deg bilaterally  SLR laying: Negative  XSLR laying: Negative  Palpable tenderness: Tenderness in the thoracolumbar juncture as well as from T5-T7 on the right side.Marland Kitchen FABER: negative. Sensory change: Gross sensation intact to all lumbar and sacral dermatomes.  Reflexes: 2+ at both patellar tendons, 2+ at achilles tendons, Babinski's downgoing.  Strength at foot  Plantar-flexion: 5/5 Dorsi-flexion: 5/5 Eversion: 5/5 Inversion: 5/5  Leg strength  Quad: 5/5  Hamstring: 5/5 Hip flexor: 5/5 Hip abductors: 5/5  Gait unremarkable. Benign hypermobility syndrome noted  Osteopathic findings Cervical C2 flexed rotated and side bent right C4 flexed rotated and side bent left C6 flexed rotated and side bent left T3 extended rotated and side bent right inhaled third rib T9 extended rotated and side bent left L2 flexed rotated and side bent right Sacrum right on right       Impression and Recommendations:     This  case required medical decision making of moderate complexity.

## 2016-12-24 ENCOUNTER — Encounter: Payer: Self-pay | Admitting: Family Medicine

## 2016-12-24 ENCOUNTER — Ambulatory Visit (INDEPENDENT_AMBULATORY_CARE_PROVIDER_SITE_OTHER): Payer: BLUE CROSS/BLUE SHIELD | Admitting: Family Medicine

## 2016-12-24 VITALS — BP 110/80 | HR 66 | Ht 66.0 in | Wt 122.0 lb

## 2016-12-24 DIAGNOSIS — M999 Biomechanical lesion, unspecified: Secondary | ICD-10-CM | POA: Diagnosis not present

## 2016-12-24 DIAGNOSIS — M940 Chondrocostal junction syndrome [Tietze]: Secondary | ICD-10-CM

## 2016-12-24 NOTE — Patient Instructions (Signed)
Goo to see you  Jaclyn [name] is your friend.  Stay active New exercises for hip abductors.  See me again in 4-6 weeks if needed.

## 2016-12-24 NOTE — Assessment & Plan Note (Signed)
Worsening symptoms. Has been near and [place] patient was seen. We discussed icing regimen and home exercises. We discussed which activities to do an which was to avoid. Patient given a refresher for scapular exercises. Well monitor for any worsening symptoms. Follow-up again in 6-8 weeks.

## 2016-12-24 NOTE — Assessment & Plan Note (Signed)
Decision today to treat with OMT was based on Physical Exam  After verbal consent patient was treated with HVLA, ME, FPR techniques in cervical, thoracic, and rib areas  Patient tolerated the procedure well with improvement in symptoms  Patient given exercises, stretches and lifestyle modifications  See medications in patient instructions if given  Patient will follow up in 4-6 weeks    

## 2017-06-24 ENCOUNTER — Ambulatory Visit (INDEPENDENT_AMBULATORY_CARE_PROVIDER_SITE_OTHER): Payer: BLUE CROSS/BLUE SHIELD | Admitting: Family Medicine

## 2017-06-24 ENCOUNTER — Encounter: Payer: Self-pay | Admitting: Family Medicine

## 2017-06-24 VITALS — BP 110/76 | HR 68 | Ht 66.0 in | Wt 118.0 lb

## 2017-06-24 DIAGNOSIS — M545 Low back pain, unspecified: Secondary | ICD-10-CM | POA: Insufficient documentation

## 2017-06-24 DIAGNOSIS — M999 Biomechanical lesion, unspecified: Secondary | ICD-10-CM

## 2017-06-24 DIAGNOSIS — G8929 Other chronic pain: Secondary | ICD-10-CM

## 2017-06-24 NOTE — Assessment & Plan Note (Signed)
Patient does have low back pain. I think some of it is secondary to scoliosis. Patient has been doing relatively better though with gaining some weight recently. Encourage her to continue to work on muscle strength and endurance. Discussed icing regimen. Follow-up again in 4-6 weeks.

## 2017-06-24 NOTE — Assessment & Plan Note (Signed)
Decision today to treat with OMT was based on Physical Exam  After verbal consent patient was treated with HVLA, ME, FPR techniques in cervical, thoracic, rib, lumbar and sacral areas  Patient tolerated the procedure well with improvement in symptoms  Patient given exercises, stretches and lifestyle modifications  See medications in patient instructions if given  Patient will follow up in 4-6 weeks 

## 2017-06-24 NOTE — Patient Instructions (Signed)
Good to see you  Jaclyn [name] is your friend.  Stay upright See me again in 4 weeks if not better You should do fine.

## 2017-06-24 NOTE — Progress Notes (Signed)
[facility] [address] [place], [place] [address] Phone: [phone] Subjective:    I'm seeing this patient by the request  of:    CC: Low back and hip pain.  BJY:[id number]:Subjective  Jaclyn KennerJulieane [name] is a 41 y.o. female coming in with complaint of Low back pain and hip pain. Patient has been seen previously for a fractured rib as well as some scoliosis. Patient has responded well to osteopathic manipulation previously. Patient has started riding bikes more often and unfortunately had a fall. Fell directly onto her right side once as well as the left side ones. Some mild increasing in pain. No bruising. Patient states that it is sore overall. Feels like she has been taking by the end of the day.     Past Medical History:  Diagnosis Date  . Allergy   . Asthma   . Chicken pox   . GERD (gastroesophageal reflux disease)    No past surgical history on file. Social History   Social History  . Marital status: Legally Separated    Spouse name: N/A  . Number of children: N/A  . Years of education: N/A   Social History Main Topics  . Smoking status: Never Smoker  . Smokeless tobacco: Never Used  . Alcohol use No  . Drug use: No  . Sexual activity: Not Asked   Other Topics Concern  . None   Social History Narrative  . None   Not on File Family History  Problem Relation Age of Onset  . Alcohol abuse Mother   . Cancer Mother   . Heart disease Mother   . Alcohol abuse Father   . Heart disease Father      Past medical history, social, surgical and family history all reviewed in electronic medical record.  No pertanent information unless stated regarding to the chief complaint.   Review of Systems:Review of systems updated and as accurate as of 06/24/17  No headache, visual changes, nausea, vomiting, diarrhea, constipation, dizziness, abdominal pain, skin rash, fevers, chills, night sweats, weight loss, swollen lymph nodes, body aches, joint swelling,  muscle aches, chest pain, shortness of breath, mood changes.   Objective  Blood pressure 110/76, pulse 68, height 5\' 6"  (1.676 m), weight 118 lb (53.5 kg), SpO2 97 %. Systems examined below as of 06/24/17   General: No apparent distress alert and oriented x3 mood and affect normal, dressed appropriately.  HEENT: Pupils equal, extraocular movements intact  Respiratory: Patient's speak in full sentences and does not appear short of breath  Cardiovascular: No lower extremity edema, non tender, no erythema  Skin: Warm dry intact with no signs of infection or rash on extremities or on axial skeleton.  Abdomen: Soft nontender  Neuro: Cranial nerves II through XII are intact, neurovascularly intact in all extremities with 2+ DTRs and 2+ pulses.  Lymph: No lymphadenopathy of posterior or anterior cervical chain or axillae bilaterally.  Gait normal with good balance and coordination.  MSK:  Non tender with full range of motion and good stability and symmetric strength and tone of shoulders, elbows, wrist, hip, knee and ankles bilaterally.  Back Exam:  Inspection: Mild loss in lordosis mild scoliosis Motion: Flexion 25 deg, Extension 45 deg, Side Bending to 45 deg bilaterally,  Rotation to 45 deg bilaterally  SLR laying: Negative  XSLR laying: Negative  Palpable tenderness: Mild to moderate tenderness to palpation mostly in the lumbosacral area. FABER: negative. Sensory change: Gross sensation intact to all lumbar  and sacral dermatomes.  Reflexes: 2+ at both patellar tendons, 2+ at achilles tendons, Babinski's downgoing.  Strength at foot  Plantar-flexion: 5/5 Dorsi-flexion: 5/5 Eversion: 5/5 Inversion: 5/5  Leg strength  Quad: 5/5 Hamstring: 5/5 Hip flexor: 5/5 Hip abductors: 5/5  Gait unremarkable.  Osteopathic findings  C2 flexed rotated and side bent right C4 flexed rotated and side bent left C6 flexed rotated and side bent left T3 extended rotated and side bent right inhaled third  rib T9 extended rotated and side bent left L2 flexed rotated and side bent right Sacrum right on right     Impression and Recommendations:     This case required medical decision making of moderate complexity.      Note: This dictation was prepared with Dragon dictation along with smaller phrase technology. Any transcriptional errors that result from this process are unintentional.

## 2017-07-20 ENCOUNTER — Encounter: Payer: Self-pay | Admitting: Family Medicine

## 2017-07-20 ENCOUNTER — Ambulatory Visit (INDEPENDENT_AMBULATORY_CARE_PROVIDER_SITE_OTHER): Payer: BLUE CROSS/BLUE SHIELD | Admitting: Family Medicine

## 2017-07-20 VITALS — BP 98/62 | HR 66 | Ht 60.0 in | Wt 116.0 lb

## 2017-07-20 DIAGNOSIS — M545 Low back pain, unspecified: Secondary | ICD-10-CM

## 2017-07-20 DIAGNOSIS — G8929 Other chronic pain: Secondary | ICD-10-CM | POA: Diagnosis not present

## 2017-07-20 DIAGNOSIS — M999 Biomechanical lesion, unspecified: Secondary | ICD-10-CM | POA: Diagnosis not present

## 2017-07-20 DIAGNOSIS — M940 Chondrocostal junction syndrome [Tietze]: Secondary | ICD-10-CM

## 2017-07-20 NOTE — Patient Instructions (Signed)
Overall not bad.  4 weeks.  Look in to suspension trainer

## 2017-07-20 NOTE — Progress Notes (Signed)
[name] [address] [place], [place] [address] Phone: [phone] Subjective:    I'm seeing this patient by the request  of:    CC: Slipped rib syndrome follow-up  BJY:NWGNFAOZHY  Jaclyn [name] is a 41 y.o. female coming in for follow up for rib pain. She has been having continued problems with the ribs slipping out of place.  Patient states that as a dull, throbbing aching pain. 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 Feels like she fatigues and then has worsening discomfort. Nothing new just worsening of previous symptoms.     Past Medical History:  Diagnosis Date  . Allergy   . Asthma   . Chicken pox   . GERD (gastroesophageal reflux disease)    No past surgical history on file. Social History   Social History  . Marital status: Legally Separated    Spouse name: N/A  . Number of children: N/A  . Years of education: N/A   Social History Main Topics  . Smoking status: Never Smoker  . Smokeless tobacco: Never Used  . Alcohol use No  . Drug use: No  . Sexual activity: Not on file   Other Topics Concern  . Not on file   Social History Narrative  . No narrative on file   Not on File Family History  Problem Relation Age of Onset  . Alcohol abuse Mother   . Cancer Mother   . Heart disease Mother   . Alcohol abuse Father   . Heart disease Father      Past medical history, social, surgical and family history all reviewed in electronic medical record.  No pertanent information unless stated regarding to the chief complaint.   Review of Systems:Review of systems updated and as accurate as of 07/20/17  No headache, visual changes, nausea, vomiting, diarrhea, constipation, dizziness, abdominal pain, skin rash, fevers, chills, night sweats, weight loss, swollen lymph nodes, body aches, joint swelling, chest pain, shortness of breath, mood changes. Positive muscle aches  Objective   There were no vitals taken for this visit. Systems examined below as of 07/20/17   General: No apparent distress alert and oriented x3 mood and affect normal, dressed appropriately.  HEENT: Pupils equal, extraocular movements intact  Respiratory: Patient's speak in full sentences and does not appear short of breath  Cardiovascular: No lower extremity edema, non tender, no erythema  Skin: Warm dry intact with no signs of infection or rash on extremities or on axial skeleton.  Abdomen: Soft nontender  Neuro: Cranial nerves II through XII are intact, neurovascularly intact in all extremities with 2+ DTRs and 2+ pulses.  Lymph: No lymphadenopathy of posterior or anterior cervical chain or axillae bilaterally.  Gait normal with good balance and coordination.  MSK:  Non tender with full range of motion and good stability and symmetric strength and tone of shoulders, elbows, wrist, hip, knee and ankles bilaterally. Poor muscle tone significant flexibility with benign hypermobility syndrome  Back Exam:  Inspection: Unremarkable  Motion: Flexion 45 deg, Extension 25 deg, Side Bending to 45 deg bilaterally,  Rotation to 45 deg bilaterally  SLR laying: Negative  XSLR laying: Negative  Palpable tenderness: Mild scapular dyskinesia noted.Marland Kitchen FABER: negative. Sensory change: Gross sensation intact to all lumbar and sacral dermatomes.  Reflexes: 2+ at both patellar tendons, 2+ at achilles tendons, Babinski's downgoing.  Strength at foot  Plantar-flexion: 5/5 Dorsi-flexion: 5/5 Eversion: 5/5 Inversion: 5/5  Leg strength  Quad: 5/5 Hamstring: 5/5 Hip flexor: 5/5 Hip abductors: 5/5  Gait unremarkable.    Osteopathic findings C2 flexed rotated and side bent right C4 flexed rotated and side bent left C6 flexed rotated and side bent left T3 extended rotated and side bent right inhaled third rib T7 extended rotated and side bent left with inhaled rib L2 flexed rotated and side bent right Sacrum right  on right  Impression and Recommendations:     This case required medical decision making of moderate complexity.      Note: This dictation was prepared with Dragon dictation along with smaller phrase technology. Any transcriptional errors that result from this process are unintentional.

## 2017-07-20 NOTE — Assessment & Plan Note (Addendum)
Decision today to treat with OMT was based on Physical Exam  After verbal consent patient was treated with HVLA, ME, FPR techniques in cervical, thoracic, rib lumbar and sacral areas  Patient tolerated the procedure well with improvement in symptoms  Patient given exercises, stretches and lifestyle modifications  See medications in patient instructions if given  Patient will follow up in 4 weeks 

## 2017-07-20 NOTE — Assessment & Plan Note (Signed)
Continues to have hypermobility No real progress Still not doing strength training.  Ice is good.  RTC in 4 weeks.

## 2017-08-14 NOTE — Progress Notes (Deleted)
  Jaclyn [name] D.O. [facility] [address] [place], [place] [address] Phone: [phone] Subjective:      CC: Back pain follow-up  IEP:PIRJJOACZYHPI:Subjective  Jaclyn [name] is a 41 y.o. female coming in with complaint of back pain. 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 Patient has had thoracic as well as lumbar spine pain. Has responded fairly well to osteopathic manipulation. Patient states       Past Medical History:  Diagnosis Date  . Allergy   . Asthma   . Chicken pox   . GERD (gastroesophageal reflux disease)    No past surgical history on file. Social History   Social History  . Marital status: Legally Separated    Spouse name: N/A  . Number of children: N/A  . Years of education: N/A   Social History Main Topics  . Smoking status: Never Smoker  . Smokeless tobacco: Never Used  . Alcohol use No  . Drug use: No  . Sexual activity: Not on file   Other Topics Concern  . Not on file   Social History Narrative  . No narrative on file   Not on File Family History  Problem Relation Age of Onset  . Alcohol abuse Mother   . Cancer Mother   . Heart disease Mother   . Alcohol abuse Father   . Heart disease Father      Past medical history, social, surgical and family history all reviewed in electronic medical record.  No pertanent information unless stated regarding to the chief complaint.   Review of Systems:Review of systems updated and as accurate as of 08/14/17  No headache, visual changes, nausea, vomiting, diarrhea, constipation, dizziness, abdominal pain, skin rash, fevers, chills, night sweats, weight loss, swollen lymph nodes, body aches, joint swelling, muscle aches, chest pain, shortness of breath, mood changes.   Objective  There were no vitals taken for this visit. Systems examined below as of 08/14/17   General: No apparent distress alert and oriented x3 mood and affect normal, dressed appropriately.    HEENT: Pupils equal, extraocular movements intact  Respiratory: Patient's speak in full sentences and does not appear short of breath  Cardiovascular: No lower extremity edema, non tender, no erythema  Skin: Warm dry intact with no signs of infection or rash on extremities or on axial skeleton.  Abdomen: Soft nontender  Neuro: Cranial nerves II through XII are intact, neurovascularly intact in all extremities with 2+ DTRs and 2+ pulses.  Lymph: No lymphadenopathy of posterior or anterior cervical chain or axillae bilaterally.  Gait normal with good balance and coordination.  MSK:  Non tender with full range of motion and good stability and symmetric strength and tone of shoulders, elbows, wrist, hip, knee and ankles bilaterally.     Impression and Recommendations:     This case required medical decision making of moderate complexity.      Note: This dictation was prepared with Dragon dictation along with smaller phrase technology. Any transcriptional errors that result from this process are unintentional.

## 2017-08-15 ENCOUNTER — Ambulatory Visit: Payer: BLUE CROSS/BLUE SHIELD | Admitting: Family Medicine

## 2017-10-31 ENCOUNTER — Ambulatory Visit: Payer: BLUE CROSS/BLUE SHIELD | Admitting: Podiatry

## 2017-10-31 ENCOUNTER — Encounter: Payer: Self-pay | Admitting: Podiatry

## 2017-10-31 VITALS — BP 91/63 | HR 69 | Resp 16

## 2017-10-31 DIAGNOSIS — Q828 Other specified congenital malformations of skin: Secondary | ICD-10-CM

## 2017-10-31 DIAGNOSIS — J309 Allergic rhinitis, unspecified: Secondary | ICD-10-CM | POA: Insufficient documentation

## 2017-10-31 DIAGNOSIS — J4530 Mild persistent asthma, uncomplicated: Secondary | ICD-10-CM | POA: Insufficient documentation

## 2017-10-31 NOTE — Progress Notes (Signed)
  Subjective:  Patient ID: Jaclyn [name], female    DOB: [birth year],  MRN: [id number] HPI Chief Complaint  Patient presents with  . Foot Pain    Plantar forefoot right - small callused areas x 2 for 3-4 weeks, no treatment, sore when barefoot  . Toe Pain    2nd and 3rd toes left - occasional tenderness at medial border     42 y.o. female presents with the above complaint.:   Past Medical History:  Diagnosis Date  . Allergy   . Asthma   . Chicken pox   . GERD (gastroesophageal reflux disease)    No past surgical history on file.  Current Outpatient Medications:  .  fluticasone (FLONASE) 50 MCG/ACT nasal spray, Place 1 spray into both nostrils daily., Disp: , Rfl:   No Known Allergies Review of Systems  All other systems reviewed and are negative.  Objective:   Vitals:   10/31/17 0928  BP: 91/63  Pulse: 69  Resp: 16    General: Well developed, nourished, in no acute distress, alert and oriented x3   Dermatological: Skin is warm, dry and supple bilateral. Nails x 10 are well maintained; remaining integument appears unremarkable at this time. There are no open sores, no preulcerative lesions, no rash or signs of infection present.  Porokeratosis plantar aspect of the forefoot right just medial to the second metatarsal phalangeal joint.  Vascular: Dorsalis Pedis artery and Posterior Tibial artery pedal pulses are 2/4 bilateral with immedate capillary fill time. Pedal hair growth present. No varicosities and no lower extremity edema present bilateral.   Neruologic: Grossly intact via light touch bilateral. Vibratory intact via tuning fork bilateral. Protective threshold with Semmes Wienstein monofilament intact to all pedal sites bilateral. Patellar and Achilles deep tendon reflexes 2+ bilateral. No Babinski or clonus noted bilateral.   Musculoskeletal: No gross boney pedal deformities bilateral. No pain, crepitus, or limitation noted with foot and ankle range of motion  bilateral. Muscular strength 5/5 in all groups tested bilateral.  Gait: Unassisted, Nonantalgic.    Radiographs:  Radiographs taken today do not demonstrate any type of major osseous abnormalities.  Assessment & Plan:   Assessment: Porokeratosis plantar aspect forefoot right.  Plan: Sharp debridement of benign lesions.     Jaclyn [name] T. [place], [place]

## 2017-11-08 ENCOUNTER — Encounter: Payer: Self-pay | Admitting: Family Medicine

## 2018-04-27 ENCOUNTER — Encounter: Payer: Self-pay | Admitting: Family Medicine

## 2018-04-28 ENCOUNTER — Ambulatory Visit: Payer: BLUE CROSS/BLUE SHIELD | Admitting: Family Medicine

## 2018-04-28 NOTE — Progress Notes (Deleted)
  Jaclyn [name] - 42 y.o. female MRN [id number]  Date of birth: [birth year]  SUBJECTIVE:  Including CC & ROS.  No chief complaint on file.   Jaclyn [name] is a 42 y.o. female that is  ***.  ***   Review of Systems  HISTORY: Past Medical, Surgical, Social, and Family History Reviewed & Updated per EMR.   Pertinent Historical Findings include:  Past Medical History:  Diagnosis Date  . Allergy   . Asthma   . Chicken pox   . GERD (gastroesophageal reflux disease)     No past surgical history on file.  No Known Allergies  Family History  Problem Relation Age of Onset  . Alcohol abuse Mother   . Cancer Mother   . Heart disease Mother   . Alcohol abuse Father   . Heart disease Father      Social History   Socioeconomic History  . Marital status: Legally Separated    Spouse name: Not on file  . Number of children: Not on file  . Years of education: Not on file  . Highest education level: Not on file  Occupational History  . Not on file  Social Needs  . Financial resource strain: Not on file  . Food insecurity:    Worry: Not on file    Inability: Not on file  . Transportation needs:    Medical: Not on file    Non-medical: Not on file  Tobacco Use  . Smoking status: Never Smoker  . Smokeless tobacco: Never Used  Substance and Sexual Activity  . Alcohol use: No    Alcohol/week: 0.0 oz  . Drug use: No  . Sexual activity: Not on file  Lifestyle  . Physical activity:    Days per week: Not on file    Minutes per session: Not on file  . Stress: Not on file  Relationships  . Social connections:    Talks on phone: Not on file    Gets together: Not on file    Attends religious service: Not on file    Active member of club or organization: Not on file    Attends meetings of clubs or organizations: Not on file    Relationship status: Not on file  . Intimate partner violence:    Fear of current or ex partner: Not on file    Emotionally abused: Not on file    Physically abused: Not on file    Forced sexual activity: Not on file  Other Topics Concern  . Not on file  Social History Narrative  . Not on file     PHYSICAL EXAM:  VS: There were no vitals taken for this visit. Physical Exam Gen: NAD, alert, cooperative with exam, well-appearing ENT: normal lips, normal nasal mucosa,  Eye: normal EOM, normal conjunctiva and lids CV:  no edema, +2 pedal pulses   Resp: no accessory muscle use, non-labored,  GI: no masses or tenderness, no hernia  Skin: no rashes, no areas of induration  Neuro: normal tone, normal sensation to touch Psych:  normal insight, alert and oriented MSK:  ***      ASSESSMENT & PLAN:   No problem-specific Assessment & Plan notes found for this encounter.

## 2018-09-27 NOTE — Progress Notes (Signed)
[facility]awana ScaleZach Nas Wafer D.O. [facility] [address] [place], [place] [address] Phone: [phone] Subjective:   [name], Valerie Wolf, am serving as a scribe for Dr. Antoine PrimasZachary Renan Danese.   CC: Right shoulder and neck pain  BJY:NWGNFAOZHYHPI:Subjective  Jaclyn KennerJulieane Morgan is a 42 y.o. female coming in with complaint of right shoulder and cervical spine pain. Has had spasm. Has been using Aleve. Did have adjustment which helped.  Still having mild tightness but nothing severe.  Patient states that the back pain seems to be a little mild as well.  Trying to stay active but finding it difficult.     Past Medical History:  Diagnosis Date  . Allergy   . Asthma   . Chicken pox   . GERD (gastroesophageal reflux disease)    No past surgical history on file. Social History   Socioeconomic History  . Marital status: Legally Separated    Spouse name: Not on file  . Number of children: Not on file  . Years of education: Not on file  . Highest education level: Not on file  Occupational History  . Not on file  Social Needs  . Financial resource strain: Not on file  . Food insecurity:    Worry: Not on file    Inability: Not on file  . Transportation needs:    Medical: Not on file    Non-medical: Not on file  Tobacco Use  . Smoking status: Never Smoker  . Smokeless tobacco: Never Used  Substance and Sexual Activity  . Alcohol use: No    Alcohol/week: 0.0 standard drinks  . Drug use: No  . Sexual activity: Not on file  Lifestyle  . Physical activity:    Days per week: Not on file    Minutes per session: Not on file  . Stress: Not on file  Relationships  . Social connections:    Talks on phone: Not on file    Gets together: Not on file    Attends religious service: Not on file    Active member of club or organization: Not on file    Attends meetings of clubs or organizations: Not on file    Relationship status: Not on file  Other Topics Concern  . Not on file  Social History Narrative  . Not on  file   No Known Allergies Family History  Problem Relation Age of Onset  . Alcohol abuse Mother   . Cancer Mother   . Heart disease Mother   . Alcohol abuse Father   . Heart disease Father       Current Outpatient Medications (Respiratory):  .  fluticasone (FLONASE) 50 MCG/ACT nasal spray, Place 1 spray into both nostrils daily.       Past medical history, social, surgical and family history all reviewed in electronic medical record.  No pertanent information unless stated regarding to the chief complaint.   Review of Systems:  No headache, visual changes, nausea, vomiting, diarrhea, constipation, dizziness, abdominal pain, skin rash, fevers, chills, night sweats, weight loss, swollen lymph nodes, body aches, joint swelling, muscle aches, chest pain, shortness of breath, mood changes.   Objective  Blood pressure 110/70, pulse 66, height 5' (1.524 m), weight 121 lb (54.9 kg), SpO2 99 %.   General: No apparent distress alert and oriented x3 mood and affect normal, dressed appropriately.  HEENT: Pupils equal, extraocular movements intact  Respiratory: Patient's speak in full sentences and does not appear short of breath  Cardiovascular: No lower extremity  edema, non tender, no erythema  Skin: Warm dry intact with no signs of infection or rash on extremities or on axial skeleton.  Abdomen: Soft nontender  Neuro: Cranial nerves II through XII are intact, neurovascularly intact in all extremities with 2+ DTRs and 2+ pulses.  Lymph: No lymphadenopathy of posterior or anterior cervical chain or axillae bilaterally.  Gait normal with good balance and coordination.  MSK:  Non tender with full range of motion and good stability and symmetric strength and tone of shoulders, elbows, wrist, hip, knee and ankles bilaterally.  Mild hypermobility.  Neck: Inspection loss of lordosis. No palpable stepoffs. Negative Spurling's maneuver. Limited range of motion in all planes Grip strength  and sensation normal in bilateral hands Strength good C4 to T1 distribution No sensory change to C4 to T1 Negative Hoffman sign bilaterally Reflexes normal Tender to palpation on the right side   Osteopathic findings C2 flexed rotated and side bent right C6 flexed rotated and side bent left T3 extended rotated and side bent right inhaled third rib T9 extended rotated and side bent left L2 flexed rotated and side bent right Sacrum right on right    Impression and Recommendations:     This case required medical decision making of moderate complexity. The above documentation has been reviewed and is accurate and complete [name], DO       Note: This dictation was prepared with Dragon dictation along with smaller phrase technology. Any transcriptional errors that result from this process are unintentional.

## 2018-09-28 ENCOUNTER — Ambulatory Visit: Payer: BLUE CROSS/BLUE SHIELD | Admitting: Family Medicine

## 2018-09-28 ENCOUNTER — Encounter: Payer: Self-pay | Admitting: Family Medicine

## 2018-09-28 VITALS — BP 110/70 | HR 66 | Ht 60.0 in | Wt 121.0 lb

## 2018-09-28 DIAGNOSIS — M999 Biomechanical lesion, unspecified: Secondary | ICD-10-CM | POA: Diagnosis not present

## 2018-09-28 DIAGNOSIS — M940 Chondrocostal junction syndrome [Tietze]: Secondary | ICD-10-CM

## 2018-09-28 NOTE — Patient Instructions (Signed)
Good to see you  Ice is you friend  Stay active  Happy holidays!  Bring bike seat up 2 finger breaths See me again in 4-8 weeks

## 2018-09-28 NOTE — Assessment & Plan Note (Signed)
Discussed with patient

## 2018-09-28 NOTE — Assessment & Plan Note (Signed)
Decision today to treat with OMT was based on Physical Exam  After verbal consent patient was treated with HVLA, ME, FPR techniques in cervical, thoracic, rib,  lumbar and sacral areas  Patient tolerated the procedure well with improvement in symptoms  Patient given exercises, stretches and lifestyle modifications  See medications in patient instructions if given  Patient will follow up in 4-8 weeks 

## 2018-09-29 ENCOUNTER — Encounter: Payer: Self-pay | Admitting: Family Medicine

## 2018-10-08 ENCOUNTER — Encounter: Payer: Self-pay | Admitting: Family Medicine

## 2019-09-26 ENCOUNTER — Telehealth: Payer: Self-pay

## 2019-09-26 ENCOUNTER — Other Ambulatory Visit: Payer: Self-pay

## 2019-09-26 ENCOUNTER — Ambulatory Visit: Payer: BLUE CROSS/BLUE SHIELD | Admitting: Family Medicine

## 2019-09-26 VITALS — BP 106/68 | HR 68 | Ht 60.0 in | Wt 132.0 lb

## 2019-09-26 DIAGNOSIS — M940 Chondrocostal junction syndrome [Tietze]: Secondary | ICD-10-CM | POA: Diagnosis not present

## 2019-09-26 DIAGNOSIS — M999 Biomechanical lesion, unspecified: Secondary | ICD-10-CM | POA: Diagnosis not present

## 2019-09-26 NOTE — Telephone Encounter (Signed)
Returned patient's call. Left message to call back.

## 2019-09-26 NOTE — Progress Notes (Signed)
[facility] [place],  [address] Phone: [phone] Subjective:   Jaclyn [name], am serving as a scribe for Dr. Hulan Saas.  This visit occurred during the SARS-CoV-2 public health emergency.  Safety protocols were in place, including screening questions prior to the visit, additional usage of staff PPE, and extensive cleaning of exam room while observing appropriate contact time as indicated for disinfecting solutions.    CC: Rib pain follow-up  BJY:NWGNFAOZHY  Jaclyn [name] is a 43 y.o. female coming in with complaint of rib pain. Last seen on 09/28/2018 for OMT. Patient states that her rib is popping often throughout the day with work and physical activity. Pain over the right side. Saturday night she rolled over and the rib popped back out again. Has had chiropractor adjust her twice in past 10 days. Wants to know what can be done in order to prevent this from occurring again.  Patient denies any true shortness of breath, any recent illnesses.    Past Medical History:  Diagnosis Date  . Allergy   . Asthma   . Chicken pox   . GERD (gastroesophageal reflux disease)    [name] past surgical history on file. Social History   Socioeconomic History  . Marital status: Legally Separated    Spouse name: Not on file  . Number of children: Not on file  . Years of education: Not on file  . Highest education level: Not on file  Occupational History  . Not on file  Social Needs  . Financial resource strain: Not on file  . Food insecurity    Worry: Not on file    Inability: Not on file  . Transportation needs    Medical: Not on file    Non-medical: Not on file  Tobacco Use  . Smoking status: Never Smoker  . Smokeless tobacco: Never Used  Substance and Sexual Activity  . Alcohol use: [name]    Alcohol/week: 0.0 standard drinks  . Drug use: [name]  . Sexual activity: Not on file  Lifestyle  . Physical activity    Days per week: Not on  file    Minutes per session: Not on file  . Stress: Not on file  Relationships  . Social Herbalist on phone: Not on file    Gets together: Not on file    Attends religious service: Not on file    Active member of club or organization: Not on file    Attends meetings of clubs or organizations: Not on file    Relationship status: Not on file  Other Topics Concern  . Not on file  Social History Narrative  . Not on file   [name] Known Allergies Family History  Problem Relation Age of Onset  . Alcohol abuse Mother   . Cancer Mother   . Heart disease Mother   . Alcohol abuse Father   . Heart disease Father       Current Outpatient Medications (Respiratory):  .  fluticasone (FLONASE) 50 MCG/ACT nasal spray, Place 1 spray into both nostrils daily.       Past medical history, social, surgical and family history all reviewed in electronic medical record.  [name] unless stated regarding to the chief complaint.   Review of Systems:  [name] headache, visual changes, nausea, vomiting, diarrhea, constipation, dizziness, abdominal pain, skin rash, fevers, chills, night sweats, weight loss, swollen lymph nodes, body aches, joint swelling, chest pain, shortness  of breath, mood changes.  Positive muscle aches  Objective  Blood pressure 106/68, pulse 68, height 5' (1.524 m), weight 132 lb (59.9 kg), SpO2 99 %.    General: [name] apparent distress alert and oriented x3 mood and affect normal, dressed appropriately.  HEENT: Pupils equal, extraocular movements intact  Respiratory: Patient's speak in full sentences and does not appear short of breath  Cardiovascular: [name] lower extremity edema, non tender, [name] erythema  Skin: Warm dry intact with [name] signs of infection or rash on extremities or on axial skeleton.  Abdomen: Soft nontender  Neuro: Cranial nerves II through XII are intact, neurovascularly intact in all extremities with 2+ DTRs and 2+ pulses.  Lymph: [name]  lymphadenopathy of posterior or anterior cervical chain or axillae bilaterally.  Gait normal with good balance and coordination.  MSK:  Non tender with full range of motion and good stability and symmetric strength and tone of shoulders, elbows, wrist, hip, knee and ankles bilaterally.  Hypermobility noted Back exam does have loss of lordosis of the lumbar spine.  Tenderness in the thoracolumbar junction.  More tenderness over the right parascapular region as well especially over T3.  [name] spinous process tenderness though noted.  5 out of 5 strength of the extremities.  Osteopathic findings  C6 flexed rotated and side bent left T3 extended rotated and side bent right inhaled third rib T9 extended rotated and side bent left L2 flexed rotated and side bent right Sacrum right on right     Impression and Recommendations:     This case required medical decision making of moderate complexity. The above documentation has been reviewed and is accurate and complete Jaclyn Saa, DO       Note: This dictation was prepared with Dragon dictation along with smaller phrase technology. Any transcriptional errors that result from this process are unintentional.

## 2019-09-26 NOTE — Patient Instructions (Addendum)
Exercises 3x a week Raise handlebars 2 finger breaths See me again in 5 weeks    [address], [place] [address] [place], [place] [address] Phone [phone]

## 2019-09-26 NOTE — Telephone Encounter (Signed)
Patient returned call. Scheduled appointment for today.

## 2019-09-27 ENCOUNTER — Encounter: Payer: Self-pay | Admitting: Family Medicine

## 2019-09-27 NOTE — Assessment & Plan Note (Signed)
Recurrent slip rib syndrome.  Patient needs to continue to work on scapular dyskinesis and increasing strengthening.  Discussed different ergonomics that could be beneficial over the course of time.  Discussed icing regimen, patient will increase activity slowly.  Follow-up again in 4 to 8 weeks

## 2019-09-27 NOTE — Assessment & Plan Note (Signed)
Decision today to treat with OMT was based on Physical Exam  After verbal consent patient was treated with HVLA, ME, FPR techniques in cervical, thoracic, rib lumbar and sacral areas  Patient tolerated the procedure well with improvement in symptoms  Patient given exercises, stretches and lifestyle modifications  See medications in patient instructions if given  Patient will follow up in 4-8 weeks 

## 2019-09-28 ENCOUNTER — Other Ambulatory Visit: Payer: Self-pay

## 2019-09-28 ENCOUNTER — Telehealth: Payer: Self-pay

## 2019-09-28 MED ORDER — NAPROXEN 500 MG PO TABS: 500.0000 mg | ORAL_TABLET | Freq: Two times a day (BID) | ORAL | 0 refills | Status: DC

## 2019-09-28 NOTE — Telephone Encounter (Signed)
Patient called stating she wanted Dr. Tamala Julian to send in an antiinflammatory. Sent in Naproxen.

## 2019-11-01 ENCOUNTER — Ambulatory Visit: Payer: BLUE CROSS/BLUE SHIELD | Admitting: Family Medicine

## 2019-11-16 ENCOUNTER — Ambulatory Visit (INDEPENDENT_AMBULATORY_CARE_PROVIDER_SITE_OTHER): Payer: No Typology Code available for payment source | Admitting: Family Medicine

## 2019-11-16 ENCOUNTER — Encounter: Payer: Self-pay | Admitting: Family Medicine

## 2019-11-16 ENCOUNTER — Other Ambulatory Visit: Payer: Self-pay

## 2019-11-16 VITALS — BP 100/80 | HR 86 | Ht 60.0 in | Wt 129.0 lb

## 2019-11-16 DIAGNOSIS — M940 Chondrocostal junction syndrome [Tietze]: Secondary | ICD-10-CM | POA: Diagnosis not present

## 2019-11-16 DIAGNOSIS — M999 Biomechanical lesion, unspecified: Secondary | ICD-10-CM

## 2019-11-16 MED ORDER — VITAMIN D (ERGOCALCIFEROL) 1.25 MG (50000 UNIT) PO CAPS: 50000.0000 [IU] | ORAL_CAPSULE | ORAL | 0 refills | Status: DC

## 2019-11-16 NOTE — Patient Instructions (Addendum)
Good to see you Once weekly vitamin D See me again 6 weeks

## 2019-11-16 NOTE — Progress Notes (Signed)
[name] [address] [place] [address] Phone: [phone] Subjective:   I [name] am serving as a Neurosurgeon for Dr. Antoine Primas.   This visit occurred during the SARS-CoV-2 public health emergency.  Safety protocols were in place, including screening questions prior to the visit, additional usage of staff PPE, and extensive cleaning of exam room while observing appropriate contact time as indicated for disinfecting solutions.   I'm seeing this patient by the request  of:  Patient, No Pcp Per  CC: Low back pain  [id number]  Jaclyn [name] is a 44 y.o. female coming in with complaint of back pain. Patient states she has been sore lately.  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  Worsening pain at the moment.  Patient has had difficulty with subsequent syndrome on multiple occasions.  Patient is having more discomfort overall.  Patient has been trying to be active but finds it sometimes difficult.     Past Medical History:  Diagnosis Date  . Allergy   . Asthma   . Chicken pox   . GERD (gastroesophageal reflux disease)    No past surgical history on file. Social History   Socioeconomic History  . Marital status: Legally Separated    Spouse name: Not on file  . Number of children: Not on file  . Years of education: Not on file  . Highest education [name]: Not on file  Occupational History  . Not on file  Tobacco Use  . Smoking status: Never Smoker  . Smokeless tobacco: Never Used  Substance and Sexual Activity  . Alcohol use: No    Alcohol/week: 0.0 standard drinks  . Drug use: No  . Sexual activity: Not on file  Other Topics Concern  . Not on file  Social History Narrative  . Not on file   Social Determinants of Health   Financial Resource Strain:   . Difficulty of Paying Living Expenses: Not on file  Food Insecurity:   . Worried About Programme researcher, broadcasting/film/video in the Last Year: Not on  file  . Ran Out of Food in the Last Year: Not on file  Transportation Needs:   . Lack of Transportation (Medical): Not on file  . Lack of Transportation (Non-Medical): Not on file  Physical Activity:   . Days of Exercise per Week: Not on file  . Minutes of Exercise per Session: Not on file  Stress:   . Feeling of Stress : Not on file  Social Connections:   . Frequency of Communication with Friends and Family: Not on file  . Frequency of Social Gatherings with Friends and Family: Not on file  . Attends Religious Services: Not on file  . Active Member of Clubs or Organizations: Not on file  . Attends Banker Meetings: Not on file  . Marital Status: Not on file   No Known Allergies Family History  Problem Relation Age of Onset  . Alcohol abuse Mother   . Cancer Mother   . Heart disease Mother   . Alcohol abuse Father   . Heart disease Father       Current Outpatient Medications (Respiratory):  .  fluticasone (FLONASE) 50 MCG/ACT nasal spray, Place 1 spray into both nostrils daily.  Current Outpatient Medications (Analgesics):  .  naproxen (NAPROSYN) 500 MG tablet, Take 1 tablet (500 mg total) by mouth 2 (two) times daily with a meal.   Current Outpatient Medications (Other):  .  Vitamin D, Ergocalciferol, (DRISDOL) 1.25 MG (50000 UNIT) CAPS capsule, Take 1 capsule (50,000 Units total) by mouth every 7 (seven) days.    Past medical history, social, surgical and family history all reviewed in electronic medical record.  No pertanent information unless stated regarding to the chief complaint.   Review of Systems:  No headache, visual changes, nausea, vomiting, diarrhea, constipation, dizziness, abdominal pain, skin rash, fevers, chills, night sweats, weight loss, swollen lymph nodes, body aches, joint swelling, chest pain, shortness of breath, mood changes. POSITIVE muscle aches  Objective  Blood pressure 100/80, pulse 86, height 5' (1.524 m), weight 129 lb  (58.5 kg), SpO2 99 %.   General: No apparent distress alert and oriented x3 mood and affect normal, dressed appropriately.  HEENT: Pupils equal, extraocular movements intact  Respiratory: Patient's speak in full sentences and does not appear short of breath  Cardiovascular: No lower extremity edema, non tender, no erythema  Skin: Warm dry intact with no signs of infection or rash on extremities or on axial skeleton.  Abdomen: Soft nontender  Neuro: Cranial nerves II through XII are intact, neurovascularly intact in all extremities with 2+ DTRs and 2+ pulses.  Lymph: No lymphadenopathy of posterior or anterior cervical chain or axillae bilaterally.  Gait normal with good balance and coordination.  MSK:  Non tender with full range of motion and good stability and symmetric strength and tone of shoulders, elbows, wrist, hip, knee and ankles bilaterally.  Back exam does have some loss of lordosis.  Some tender to palpation in the paraspinal musculature lumbar spine right greater than left.  More in the thoracolumbar juncture.  Some in the scapular region as well.  Osteopathic findings  C2 flexed rotated and side bent right C7 flexed rotated and side bent left T3 extended rotated and side bent right inhaled third rib T9 extended rotated and side bent left L2 flexed rotated and side bent right Sacrum right on right    Impression and Recommendations:     This case required medical decision making of moderate complexity. The above documentation has been reviewed and is accurate and complete Lyndal Pulley, DO       Note: This dictation was prepared with Dragon dictation along with smaller phrase technology. Any transcriptional errors that result from this process are unintentional.

## 2019-11-16 NOTE — Assessment & Plan Note (Signed)
Continues to have some difficulty.  Discussed which activities to do which wants to avoid.  Discussed icing regimen and home exercise, patient will increase activity slowly.  Follow-up again in 4 to 8 weeks

## 2019-11-16 NOTE — Assessment & Plan Note (Signed)
Decision today to treat with OMT was based on Physical Exam  After verbal consent patient was treated with HVLA, ME, FPR techniques in cervical, thoracic, rib,  lumbar and sacral areas  Patient tolerated the procedure well with improvement in symptoms  Patient given exercises, stretches and lifestyle modifications  See medications in patient instructions if given  Patient will follow up in 4-8 weeks 

## 2019-12-28 ENCOUNTER — Encounter: Payer: Self-pay | Admitting: Family Medicine

## 2019-12-28 ENCOUNTER — Other Ambulatory Visit: Payer: Self-pay

## 2019-12-28 ENCOUNTER — Ambulatory Visit (INDEPENDENT_AMBULATORY_CARE_PROVIDER_SITE_OTHER): Payer: No Typology Code available for payment source | Admitting: Family Medicine

## 2019-12-28 VITALS — BP 100/70 | HR 61 | Ht 60.0 in | Wt 131.0 lb

## 2019-12-28 DIAGNOSIS — M999 Biomechanical lesion, unspecified: Secondary | ICD-10-CM | POA: Diagnosis not present

## 2019-12-28 DIAGNOSIS — M940 Chondrocostal junction syndrome [Tietze]: Secondary | ICD-10-CM

## 2019-12-28 NOTE — Assessment & Plan Note (Signed)
Continues to have some difficulty. Chronic problem with an exacerbation. We discussed again about the importance of core strengthening. Patient is not doing fantastic with her diet or protein intake. Discussed with patient about icing regimen, home exercise, which activities to do which wants to avoid. Patient will increase activity slowly over the course of next several weeks.

## 2019-12-28 NOTE — Progress Notes (Signed)
[name] [address] [place] [address] Phone: [phone] Subjective:   I Jaclyn [name] am serving as a Neurosurgeon for Dr. Antoine Primas.  This visit occurred during the SARS-CoV-2 public health emergency.  Safety protocols were in place, including screening questions prior to the visit, additional usage of staff PPE, and extensive cleaning of exam room while observing appropriate contact time as indicated for disinfecting solutions.   I'm seeing this patient by the request  of:  Patient, No Pcp Per  CC: And rib pain follow-up  [id number]   Jaclyn [name] is a 44 y.o. female coming in with complaint of back pain. Last seen on 11/16/2019 for OMT. Patient states she believes a few of her ribs are out. 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 Feels like this is playing a role. Patient feels like she just fatigues and then the ribs seem to move out of place.      Past Medical History:  Diagnosis Date  . Allergy   . Asthma   . Chicken pox   . GERD (gastroesophageal reflux disease)    No past surgical history on file. Social History   Socioeconomic History  . Marital status: Legally Separated    Spouse name: Not on file  . Number of children: Not on file  . Years of education: Not on file  . Highest education level: Not on file  Occupational History  . Not on file  Tobacco Use  . Smoking status: Never Smoker  . Smokeless tobacco: Never Used  Substance and Sexual Activity  . Alcohol use: No    Alcohol/week: 0.0 standard drinks  . Drug use: No  . Sexual activity: Not on file  Other Topics Concern  . Not on file  Social History Narrative  . Not on file   Social Determinants of Health   Financial Resource Strain:   . Difficulty of Paying Living Expenses: Not on file  Food Insecurity:   . Worried About Programme researcher, broadcasting/film/video in the Last Year: Not on file  . Ran Out of Food in the Last Year: Not on file  Transportation  Needs:   . Lack of Transportation (Medical): Not on file  . Lack of Transportation (Non-Medical): Not on file  Physical Activity:   . Days of Exercise per Week: Not on file  . Minutes of Exercise per Session: Not on file  Stress:   . Feeling of Stress : Not on file  Social Connections:   . Frequency of Communication with Friends and Family: Not on file  . Frequency of Social Gatherings with Friends and Family: Not on file  . Attends Religious Services: Not on file  . Active Member of Clubs or Organizations: Not on file  . Attends Banker Meetings: Not on file  . Marital Status: Not on file   No Known Allergies Family History  Problem Relation Age of Onset  . Alcohol abuse Mother   . Cancer Mother   . Heart disease Mother   . Alcohol abuse Father   . Heart disease Father       Current Outpatient Medications (Respiratory):  .  fluticasone (FLONASE) 50 MCG/ACT nasal spray, Place 1 spray into both nostrils daily.  Current Outpatient Medications (Analgesics):  .  naproxen (NAPROSYN) 500 MG tablet, Take 1 tablet (500 mg total) by mouth 2 (two) times daily with a meal.   Current Outpatient Medications (Other):  Marland Kitchen  Vitamin  D, Ergocalciferol, (DRISDOL) 1.25 MG (50000 UNIT) CAPS capsule, Take 1 capsule (50,000 Units total) by mouth every 7 (seven) days.   Reviewed prior external information including notes and imaging from  primary care provider As well as notes that were available from care everywhere and other healthcare systems.  Past medical history, social, surgical and family history all reviewed in electronic medical record.  No pertanent information unless stated regarding to the chief complaint.   Review of Systems:  No headache, visual changes, nausea, vomiting, diarrhea, constipation, dizziness, abdominal pain, skin rash, fevers, chills, night sweats, weight loss, swollen lymph nodes, body aches, joint swelling, chest pain, shortness of breath, mood  changes. POSITIVE muscle aches  Objective  Blood pressure 100/70, pulse 61, height 5' (1.524 m), weight 131 lb (59.4 kg), SpO2 98 %.   General: No apparent distress alert and oriented x3 mood and affect normal, dressed appropriately.  HEENT: Pupils equal, extraocular movements intact  Respiratory: Patient's speak in full sentences and does not appear short of breath  Cardiovascular: No lower extremity edema, non tender, no erythema  Skin: Warm dry intact with no signs of infection or rash on extremities or on axial skeleton.  Abdomen: Soft nontender  Neuro: Cranial nerves II through XII are intact, neurovascularly intact in all extremities with 2+ DTRs and 2+ pulses.  Lymph: No lymphadenopathy of posterior or anterior cervical chain or axillae bilaterally.  Gait normal with good balance and coordination.  MSK:  Non tender with full range of motion and good stability and symmetric strength and tone of shoulders, elbows, wrist, hip, knee and ankles bilaterally.  Mild hypermobility  Patient does have some tenderness to palpation in the parascapular region in the thoracolumbar junction right greater than left. Patient does have what appears to be a slipped rib on the right side. Good range of motion though of the scapulas bilaterally.  Osteopathic findings  C6 flexed rotated and side bent left T3 extended rotated and side bent right inhaled third rib T9 extended rotated and side bent left L2 flexed rotated and side bent right Sacrum right on right     Impression and Recommendations:     This case required medical decision making of moderate complexity. The above documentation has been reviewed and is accurate and complete Lyndal Pulley, DO       Note: This dictation was prepared with Dragon dictation along with smaller phrase technology. Any transcriptional errors that result from this process are unintentional.

## 2019-12-28 NOTE — Assessment & Plan Note (Signed)
Decision today to treat with OMT was based on Physical Exam  After verbal consent patient was treated with HVLA, ME, FPR techniques in cervical, thoracic, rib,  lumbar and sacral areas  Patient tolerated the procedure well with improvement in symptoms  Patient given exercises, stretches and lifestyle modifications  See medications in patient instructions if given  Patient will follow up in 4-8 weeks 

## 2019-12-28 NOTE — Patient Instructions (Signed)
Duexis 3 times a day for 3 days See me again in 6 weeks

## 2020-02-08 ENCOUNTER — Other Ambulatory Visit: Payer: Self-pay

## 2020-02-08 ENCOUNTER — Encounter: Payer: Self-pay | Admitting: Family Medicine

## 2020-02-08 ENCOUNTER — Ambulatory Visit (INDEPENDENT_AMBULATORY_CARE_PROVIDER_SITE_OTHER): Payer: No Typology Code available for payment source | Admitting: Family Medicine

## 2020-02-08 VITALS — BP 104/68 | Ht 60.0 in

## 2020-02-08 DIAGNOSIS — M999 Biomechanical lesion, unspecified: Secondary | ICD-10-CM

## 2020-02-08 DIAGNOSIS — M940 Chondrocostal junction syndrome [Tietze]: Secondary | ICD-10-CM

## 2020-02-08 NOTE — Progress Notes (Signed)
[facility] [place] Phone: [phone] Subjective:   Fontaine No, am serving as a scribe for Dr. Hulan Saas. This visit occurred during the SARS-CoV-2 public health emergency.  Safety protocols were in place, including screening questions prior to the visit, additional usage of staff PPE, and extensive cleaning of exam room while observing appropriate contact time as indicated for disinfecting solutions.   I'm seeing this patient by the request  of:  Patient, No Pcp Per  CC: Upper back pain follow-up  [id number]  Jaclyn [name] is a 44 y.o. female coming in with complaint of back pain. Last seen on 12/28/2019 for OMT. Patient states that she has been having some neck tightness but otherwise no flares since last visit.  Patient has noticed more of a tightness recently.  Has been working little bit more.  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     Past Medical History:  Diagnosis Date  . Allergy   . Asthma   . Chicken pox   . GERD (gastroesophageal reflux disease)    No past surgical history on file. Social History   Socioeconomic History  . Marital status: Legally Separated    Spouse name: Not on file  . Number of children: Not on file  . Years of education: Not on file  . Highest education level: Not on file  Occupational History  . Not on file  Tobacco Use  . Smoking status: Never Smoker  . Smokeless tobacco: Never Used  Substance and Sexual Activity  . Alcohol use: No    Alcohol/week: 0.0 standard drinks  . Drug use: No  . Sexual activity: Not on file  Other Topics Concern  . Not on file  Social History Narrative  . Not on file   Social Determinants of Health   Financial Resource Strain:   . Difficulty of Paying Living Expenses:   Food Insecurity:   . Worried About Charity fundraiser in the Last Year:   . Youth worker in the Last Year:   Transportation Needs:   . Film/video editor (Medical):   Marland Kitchen Lack of Transportation (Non-Medical):   Physical Activity:   . Days of Exercise per Week:   . Minutes of Exercise per Session:   Stress:   . Feeling of Stress :   Social Connections:   . Frequency of Communication with Friends and Family:   . Frequency of Social Gatherings with Friends and Family:   . Attends Religious Services:   . Active Member of Clubs or Organizations:   . Attends Archivist Meetings:   Marland Kitchen Marital Status:    No Known Allergies Family History  Problem Relation Age of Onset  . Alcohol abuse Mother   . Cancer Mother   . Heart disease Mother   . Alcohol abuse Father   . Heart disease Father       Current Outpatient Medications (Respiratory):  .  fluticasone (FLONASE) 50 MCG/ACT nasal spray, Place 1 spray into both nostrils daily.  Current Outpatient Medications (Analgesics):  .  naproxen (NAPROSYN) 500 MG tablet, Take 1 tablet (500 mg total) by mouth 2 (two) times daily with a meal.   Current Outpatient Medications (Other):  Marland Kitchen  Vitamin D, Ergocalciferol, (DRISDOL) 1.25 MG (50000 UNIT) CAPS capsule, Take 1 capsule (50,000 Units total) by mouth every 7 (seven) days.  Reviewed prior external information including notes and imaging from  primary care provider As well as notes that were available from care everywhere and other healthcare systems.  Past medical history, social, surgical and family history all reviewed in electronic medical record.  No pertanent information unless stated regarding to the chief complaint.   Review of Systems:  No headache, visual changes, nausea, vomiting, diarrhea, constipation, dizziness, abdominal pain, skin rash, fevers, chills, night sweats, weight loss, swollen lymph nodes, body aches, joint swelling, chest pain, shortness of breath, mood changes. POSITIVE muscle aches  Objective  Blood pressure 104/68, height 5'  (1.524 m).   General: No apparent distress alert and oriented x3 mood and affect normal, dressed appropriately.  HEENT: Pupils equal, extraocular movements intact  Respiratory: Patient's speak in full sentences and does not appear short of breath  Cardiovascular: No lower extremity edema, non tender, no erythema  Neuro: Cranial nerves II through XII are intact, neurovascularly intact in all extremities with 2+ DTRs and 2+ pulses.  Gait normal with good balance and coordination.  MSK:  Non tender with full range of motion and good stability and symmetric strength and tone of shoulders, elbows, wrist, hip, knee and ankles bilaterally.  Patient's upper back does have significant tightness noted in the parascapular region on the right side more than left.  Patient does have a slight bruise still noted on the left side.  Patient does have tightness in the neck with side bending and rotation to the right.  Negative Spurling's.  Osteopathic findings C2 flexed rotated and side bent right C5 flexed rotated and side bent left T3 extended rotated and side bent right inhaled third rib T9 extended rotated and side bent left L2 flexed rotated and side bent right Sacrum right on right    Impression and Recommendations:     This case required medical decision making of moderate complexity. The above documentation has been reviewed and is accurate and complete [name], DO       Note: This dictation was prepared with Dragon dictation along with smaller phrase technology. Any transcriptional errors that result from this process are unintentional.

## 2020-02-08 NOTE — Assessment & Plan Note (Signed)

## 2020-02-08 NOTE — Patient Instructions (Signed)
See me in 6 weeks

## 2020-02-08 NOTE — Assessment & Plan Note (Signed)
Chronic problem.  Exacerbation again.  Discussed vitamin D and the naproxen as needed.  Responds well to manipulation.  Discussed posture and ergonomics, we discussed ergonomics with her doing more the massage.  Follow-up again in 6 to 8 weeks

## 2020-03-20 ENCOUNTER — Ambulatory Visit (INDEPENDENT_AMBULATORY_CARE_PROVIDER_SITE_OTHER): Payer: 59 | Admitting: Family Medicine

## 2020-03-20 VITALS — BP 110/62 | HR 69 | Ht 60.0 in | Wt 134.0 lb

## 2020-03-20 DIAGNOSIS — M940 Chondrocostal junction syndrome [Tietze]: Secondary | ICD-10-CM

## 2020-03-20 DIAGNOSIS — M999 Biomechanical lesion, unspecified: Secondary | ICD-10-CM

## 2020-03-20 NOTE — Patient Instructions (Signed)
Pepto 3x a day Glutathione any dose will do Nexium 2x a day for 2 weeks See me in 5 weeks

## 2020-03-20 NOTE — Progress Notes (Signed)
[facility] Phone: [phone] Subjective:   Jaclyn [name], am serving as a scribe for Dr. Hulan Saas. This visit occurred during the SARS-CoV-2 public health emergency.  Safety protocols were in place, including screening questions prior to the visit, additional usage of staff PPE, and extensive cleaning of exam room while observing appropriate contact time as indicated for disinfecting solutions.   I'm seeing this patient by the request  of:  Patient, [name] Pcp Per  CC: Upper back pain follow-up  [id number]  Jaclyn [name] is a 44 y.o. female coming in with complaint of backpain neck pain follow-up. Last seen on 02/08/2020 for OMT. Patient states she has been having some upper GI issues. Feels like 2nd and 3rd ribs out of place as well.   Patient feels like certain new medications including the progesterone that her primary care provider put her on seem to cause some more difficulty.  Patient is wondering if there is anything else that we would suggest at the moment.  Feels that the inflammation in the stomach is likely contributing to some of the discomfort in the upper back as well.       Past Medical History:  Diagnosis Date  . Allergy   . Asthma   . Chicken pox   . GERD (gastroesophageal reflux disease)    [name] past surgical history on file. Social History   Socioeconomic History  . Marital status: Legally Separated    Spouse name: Not on file  . Number of children: Not on file  . Years of education: Not on file  . Highest education level: Not on file  Occupational History  . Not on file  Tobacco Use  . Smoking status: Never Smoker  . Smokeless tobacco: Never Used  Substance and Sexual Activity  . Alcohol use: [name]    Alcohol/week: 0.0 standard drinks  . Drug use: [name]  . Sexual activity: Not on file  Other Topics Concern  . Not on file  Social History Narrative  . Not on file   Social  Determinants of Health   Financial Resource Strain:   . Difficulty of Paying Living Expenses:   Food Insecurity:   . Worried About Charity fundraiser in the Last Year:   . Arboriculturist in the Last Year:   Transportation Needs:   . Film/video editor (Medical):   Marland Kitchen Lack of Transportation (Non-Medical):   Physical Activity:   . Days of Exercise per Week:   . Minutes of Exercise per Session:   Stress:   . Feeling of Stress :   Social Connections:   . Frequency of Communication with Friends and Family:   . Frequency of Social Gatherings with Friends and Family:   . Attends Religious Services:   . Active Member of Clubs or Organizations:   . Attends Archivist Meetings:   Marland Kitchen Marital Status:    [name] Known Allergies Family History  Problem Relation Age of Onset  . Alcohol abuse Mother   . Cancer Mother   . Heart disease Mother   . Alcohol abuse Father   . Heart disease Father       Current Outpatient Medications (Respiratory):  .  fluticasone (FLONASE) 50 MCG/ACT nasal spray, Place 1 spray into both nostrils daily.  Current Outpatient Medications (Analgesics):  .  naproxen (NAPROSYN) 500 MG tablet, Take 1 tablet (500 mg total) by mouth 2 (two) times daily  with a meal.   Current Outpatient Medications (Other):  Marland Kitchen  Vitamin D, Ergocalciferol, (DRISDOL) 1.25 MG (50000 UNIT) CAPS capsule, Take 1 capsule (50,000 Units total) by mouth every 7 (seven) days.   Reviewed prior external information including notes and imaging from  primary care provider As well as notes that were available from care everywhere and other healthcare systems.  Past medical history, social, surgical and family history all reviewed in electronic medical record.  [name] unless stated regarding to the chief complaint.   Review of Systems:  [name] headache, visual changes, nausea, vomiting, diarrhea, constipation, dizziness, abdominal pain, skin rash, fevers, chills, night  sweats, weight loss, swollen lymph nodes, body aches, joint swelling, chest pain, shortness of breath, mood changes. POSITIVE muscle aches  Objective  Blood pressure 110/62, pulse 69, height 5' (1.524 m), weight 134 lb (60.8 kg), SpO2 99 %.   General: [name] apparent distress alert and oriented x3 mood and affect normal, dressed appropriately.  HEENT: Pupils equal, extraocular movements intact  Respiratory: Patient's speak in full sentences and does not appear short of breath  Cardiovascular: [name] lower extremity edema, non tender, [name] erythema  Neuro: Cranial nerves II through XII are intact, neurovascularly intact in all extremities with 2+ DTRs and 2+ pulses.  Gait normal with good balance and coordination.  MSK:  Non tender with full range of motion and good stability and symmetric strength and tone of shoulders, elbows, wrist, hip, knee and ankles bilaterally.  Upper back shows that patient does have significant tenderness to palpation in the parascapular region right greater than left.  Abdominal exam does show the patient has some tenderness to palpation in the epigastric region.  [name] involuntary guarding [name] noted [name] rebound tenderness  Osteopathic findings  C2 flexed rotated and side bent right C6 flexed rotated and side bent left T3 extended rotated and side bent right inhaled third rib T9 extended rotated and side bent left L2 flexed rotated and side bent right Sacrum right on right    Impression and Recommendations:     This case required medical decision making of moderate complexity. The above documentation has been reviewed and is accurate and complete [name], DO       Note: This dictation was prepared with Dragon dictation along with smaller phrase technology. Any transcriptional errors that result from this process are unintentional.

## 2020-03-21 ENCOUNTER — Telehealth: Payer: Self-pay | Admitting: Family Medicine

## 2020-03-21 ENCOUNTER — Encounter: Payer: Self-pay | Admitting: Family Medicine

## 2020-03-21 NOTE — Telephone Encounter (Signed)
1. No pepto then can try maalox.  2. No unfortantely would be cheaper over the counter  3. Look at liposomal glutathione

## 2020-03-21 NOTE — Telephone Encounter (Signed)
Left msg for pt to call for response.

## 2020-03-21 NOTE — Assessment & Plan Note (Signed)
Chronic problem with exacerbation.  Discussed the naproxen and vitamin D supplementation.  Discussed which activities to doing which wants to avoid.  Patient is to increase activity slowly over the course the next several weeks.  Patient will follow up with me again in 4 to 8 weeks otherwise.  We did make some suggestions of different vitamin and different medications to take regularly over-the-counter.

## 2020-03-21 NOTE — Telephone Encounter (Signed)
Patient called with a few follow up questions for Dr [name]. Her MyChart is not working, so she called.  1. Dr [name] recommended Pepto. 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 Is there anything else she could try?   2. Would it be cheaper for Dr [name] to send in a prescription for Nexium rather than buying it over the counter?  3. What kind of Glutathione would Dr [name] recommend she try? She said that when she looked it up there were a couple different ones.   Please advise.

## 2020-03-21 NOTE — Assessment & Plan Note (Signed)

## 2020-03-27 ENCOUNTER — Telehealth: Payer: Self-pay | Admitting: Family Medicine

## 2020-03-27 NOTE — Telephone Encounter (Signed)
Called pt, MD responses given and understood.

## 2020-03-27 NOTE — Telephone Encounter (Signed)
Pt would like your recommendation for a PCP that you think would be a match for her, she does not mind travelling for the right provider.

## 2020-03-28 NOTE — Telephone Encounter (Signed)
Pt notified via VM and My Chart

## 2020-04-24 ENCOUNTER — Other Ambulatory Visit: Payer: Self-pay

## 2020-04-24 ENCOUNTER — Encounter: Payer: Self-pay | Admitting: Family Medicine

## 2020-04-24 ENCOUNTER — Ambulatory Visit (INDEPENDENT_AMBULATORY_CARE_PROVIDER_SITE_OTHER): Payer: 59 | Admitting: Family Medicine

## 2020-04-24 VITALS — BP 110/62 | HR 75 | Ht 60.0 in | Wt 132.0 lb

## 2020-04-24 DIAGNOSIS — M999 Biomechanical lesion, unspecified: Secondary | ICD-10-CM

## 2020-04-24 DIAGNOSIS — M940 Chondrocostal junction syndrome [Tietze]: Secondary | ICD-10-CM | POA: Diagnosis not present

## 2020-04-24 NOTE — Assessment & Plan Note (Signed)
Stable at the moment but seems to be improving. Discussed icing regimen and home exercise. Continue vitamin D. Started that the glutathione seems to be helping patient's stomach at the moment. Increase activity as tolerated.

## 2020-04-24 NOTE — Progress Notes (Signed)
[name] [address] [place] [address] Phone: [phone] Subjective:   Jaclyn [name], am serving as a scribe for Dr. Antoine Primas. This visit occurred during the SARS-CoV-2 public health emergency.  Safety protocols were in place, including screening questions prior to the visit, additional usage of staff PPE, and extensive cleaning of exam room while observing appropriate contact time as indicated for disinfecting solutions.   I'm seeing this patient by the request  of:  Patient, No Pcp Per  CC: Back pain, rib pain follow-up  [id number]  Jaclyn [name] is a 44 y.o. female coming in with complaint of back and neck pain. OMT 03/20/2020. Patient states she has not had any changes in her pain since last visit.  Patient is actually making improvement at the moment. Feels the glutathione has helped her stomach more than anything else. Patient is looking forward to trying to diversify her diet in the near future.         Reviewed prior external information including notes and imaging from previsou exam, outside providers and external EMR if available.   As well as notes that were available from care everywhere and other healthcare systems.  Past medical history, social, surgical and family history all reviewed in electronic medical record.  No pertanent information unless stated regarding to the chief complaint.   Past Medical History:  Diagnosis Date  . Allergy   . Asthma   . Chicken pox   . GERD (gastroesophageal reflux disease)     No Known Allergies   Review of Systems:  No headache, visual changes, nausea, vomiting, diarrhea, constipation, dizziness, abdominal pain, skin rash, fevers, chills, night sweats, weight loss, swollen lymph nodes, body aches, joint swelling, chest pain, shortness of breath, mood changes. POSITIVE muscle aches  Objective  Blood pressure 110/62, pulse 75, height 5' (1.524 m), weight 132 lb (59.9 kg),  SpO2 98 %.   General: No apparent distress alert and oriented x3 mood and affect normal, dressed appropriately.  HEENT: Pupils equal, extraocular movements intact  Respiratory: Patient's speak in full sentences and does not appear short of breath  Cardiovascular: No lower extremity edema, non tender, no erythema  Neuro: Cranial nerves II through XII are intact, neurovascularly intact in all extremities with 2+ DTRs and 2+ pulses.  Gait normal with good balance and coordination.  MSK:  Non tender with full range of motion and good stability and symmetric strength and tone of shoulders, elbows, wrist, hip, knee and ankles bilaterally.  Back -upper back actually feels more tightness on the left side of the parascapular region at this time.  No spinous process tenderness. Back no significant very mild discomfort as well.  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 Discussed icing regimen and home exercise. Continue vitamin D. Started that the glutathione seems to be helping patient's stomach at the moment. Increase activity as tolerated.    Nonallopathic problems  Decision today to treat with OMT was based on Physical Exam  After verbal consent patient was treated with HVLA, ME, FPR techniques in cervical, rib, thoracic, lumbar, and sacral  areas  Patient tolerated the procedure well with improvement in symptoms  Patient  given exercises, stretches and lifestyle modifications  See medications in patient instructions if given  Patient will follow up in 4-8 weeks      The above documentation has been reviewed and is accurate and complete [name], DO       Note: This dictation was prepared with Dragon  dictation along with smaller phrase technology. Any transcriptional errors that result from this process are unintentional.

## 2020-04-24 NOTE — Patient Instructions (Signed)
500-1500mg  of glutathione 1/2 cup gatorade per cup of water when riding outside See me in 6-8 weeks

## 2020-05-16 ENCOUNTER — Encounter: Payer: Self-pay | Admitting: Family Medicine

## 2020-05-16 ENCOUNTER — Other Ambulatory Visit: Payer: Self-pay

## 2020-05-16 ENCOUNTER — Ambulatory Visit (INDEPENDENT_AMBULATORY_CARE_PROVIDER_SITE_OTHER): Payer: 59 | Admitting: Family Medicine

## 2020-05-16 VITALS — BP 90/60 | HR 66 | Ht 60.0 in | Wt 134.0 lb

## 2020-05-16 DIAGNOSIS — M9984 Other biomechanical lesions of sacral region: Secondary | ICD-10-CM

## 2020-05-16 DIAGNOSIS — M999 Biomechanical lesion, unspecified: Secondary | ICD-10-CM

## 2020-05-16 DIAGNOSIS — M9988 Other biomechanical lesions of rib cage: Secondary | ICD-10-CM | POA: Diagnosis not present

## 2020-05-16 DIAGNOSIS — M9983 Other biomechanical lesions of lumbar region: Secondary | ICD-10-CM | POA: Diagnosis not present

## 2020-05-16 DIAGNOSIS — M940 Chondrocostal junction syndrome [Tietze]: Secondary | ICD-10-CM | POA: Diagnosis not present

## 2020-05-16 DIAGNOSIS — M9982 Other biomechanical lesions of thoracic region: Secondary | ICD-10-CM

## 2020-05-16 MED ORDER — METHYLPREDNISOLONE ACETATE 80 MG/ML IJ SUSP
80.0000 mg | Freq: Once | INTRAMUSCULAR | Status: AC
  Administered 2020-05-16: 80 mg via INTRAMUSCULAR

## 2020-05-16 MED ORDER — KETOROLAC TROMETHAMINE 30 MG/ML IJ SOLN
60.0000 mg | Freq: Once | INTRAMUSCULAR | Status: AC
  Administered 2020-05-16: 60 mg via INTRAMUSCULAR

## 2020-05-16 NOTE — Assessment & Plan Note (Signed)
Continues to have difficulty.  Has made some progress over the course of time but is having exacerbation.  Discussed the naproxen on a more regular basis.  Toradol and Depo-Medrol given for this exacerbation.  Follow-up again in 4 to 8 weeks.

## 2020-05-16 NOTE — Patient Instructions (Signed)
Good to see you  Injection given today Move probiotic to night time Add glutathione supplement 1 time daily  See me again as scheduled or in 4-6 weeks

## 2020-05-16 NOTE — Progress Notes (Signed)
[name] [address] [place] [address] Phone: [phone] Subjective:   I Jaclyn [name] am serving as a Neurosurgeon for Dr. Antoine [name].  This visit occurred during the SARS-CoV-2 public health emergency.  Safety protocols were in place, including screening questions prior to the visit, additional usage of staff PPE, and extensive cleaning of exam room while observing appropriate contact time as indicated for disinfecting solutions.   I'm seeing this patient by the request  of:  Patient, No Pcp Per  CC: Upper back pain follow-up  [id number]   04/24/2020 Stable at the moment but seems to be improving. Discussed icing regimen and home exercise. Continue vitamin D. Started that the glutathione seems to be helping patient's stomach at the moment. Increase activity as tolerated.  05/16/2020 Jaclyn [name] is a 44 y.o. female coming in with complaint of rib pain. Patient states she has a right sided dislocated rib. Doesn't remember how it happened. Pain with breathing sometimes. Muscles were sore before dislocation.   Onset- 2 days ago      Past Medical History:  Diagnosis Date  . Allergy   . Asthma   . Chicken pox   . GERD (gastroesophageal reflux disease)    No past surgical history on file. Social History   Socioeconomic History  . Marital status: Legally Separated    Spouse name: Not on file  . Number of children: Not on file  . Years of education: Not on file  . Highest education level: Not on file  Occupational History  . Not on file  Tobacco Use  . Smoking status: Never Smoker  . Smokeless tobacco: Never Used  Substance and Sexual Activity  . Alcohol use: No    Alcohol/week: 0.0 standard drinks  . Drug use: No  . Sexual activity: Not on file  Other Topics Concern  . Not on file  Social History Narrative  . Not on file   Social Determinants of Health   Financial Resource Strain:   . Difficulty of Paying Living  Expenses:   Food Insecurity:   . Worried About Programme researcher, broadcasting/film/video in the Last Year:   . Barista in the Last Year:   Transportation Needs:   . Freight forwarder (Medical):   Marland Kitchen Lack of Transportation (Non-Medical):   Physical Activity:   . Days of Exercise per Week:   . Minutes of Exercise per Session:   Stress:   . Feeling of Stress :   Social Connections:   . Frequency of Communication with Friends and Family:   . Frequency of Social Gatherings with Friends and Family:   . Attends Religious Services:   . Active Member of Clubs or Organizations:   . Attends Banker Meetings:   Marland Kitchen Marital Status:    No Known Allergies Family History  Problem Relation Age of Onset  . Alcohol abuse Mother   . Cancer Mother   . Heart disease Mother   . Alcohol abuse Father   . Heart disease Father       Current Outpatient Medications (Respiratory):  .  fluticasone (FLONASE) 50 MCG/ACT nasal spray, Place 1 spray into both nostrils daily.  Current Outpatient Medications (Analgesics):  .  naproxen (NAPROSYN) 500 MG tablet, Take 1 tablet (500 mg total) by mouth 2 (two) times daily with a meal.   Current Outpatient Medications (Other):  Marland Kitchen  Vitamin D, Ergocalciferol, (DRISDOL) 1.25 MG (50000 UNIT) CAPS capsule, Take 1  capsule (50,000 Units total) by mouth every 7 (seven) days.   Reviewed prior external information including notes and imaging from  primary care provider As well as notes that were available from care everywhere and other healthcare systems.  Past medical history, social, surgical and family history all reviewed in electronic medical record.  No pertanent information unless stated regarding to the chief complaint.   Review of Systems:  No headache, visual changes, nausea, vomiting, diarrhea, constipation, dizziness, abdominal pain, skin rash, fevers, chills, night sweats, weight loss, swollen lymph nodes, , joint swelling, chest pain, shortness of breath,  mood changes. POSITIVE muscle aches, body aches, fatigue  Objective  Blood pressure (!) 90/60, pulse 66, height 5' (1.524 m), weight 134 lb (60.8 kg), SpO2 99 %.   General: No apparent distress alert and oriented x3 mood and affect normal, dressed appropriately.  HEENT: Pupils equal, extraocular movements intact  Respiratory: Patient's speak in full sentences and does not appear short of breath  Cardiovascular: No lower extremity edema, non tender, no erythema  Neuro: Cranial nerves II through XII are intact, neurovascularly intact in all extremities with 2+ DTRs and 2+ pulses.  Gait normal with good balance and coordination.  MSK:  tender with full range of motion and good stability and symmetric strength and tone of shoulders, elbows, wrist, hip, knee and ankles bilaterally.  Patient continues to have tightness in the parascapular region right greater than left.  Tender to palpation diffusely in this area.  Patient does have more tightness in the left side of the neck as well.  Negative Spurling's.  Tightness in the lower back but none does have full range of motion passively.  Osteopathic findings C3 flexed rotated and side bent right C6 flexed rotated and side bent left T3 extended rotated and side bent left inhaled third rib T6 extended rotated and side bent right inhaled rib L2 flexed rotated and side bent right Sacrum right on right    Impression and Recommendations:     The above documentation has been reviewed and is accurate and complete Jaclyn Saa, DO       Note: This dictation was prepared with Dragon dictation along with smaller phrase technology. Any transcriptional errors that result from this process are unintentional.

## 2020-05-22 ENCOUNTER — Ambulatory Visit: Payer: 59 | Admitting: Family Medicine

## 2020-06-12 ENCOUNTER — Ambulatory Visit (INDEPENDENT_AMBULATORY_CARE_PROVIDER_SITE_OTHER): Payer: 59 | Admitting: Family Medicine

## 2020-06-12 ENCOUNTER — Other Ambulatory Visit: Payer: Self-pay

## 2020-06-12 ENCOUNTER — Encounter: Payer: Self-pay | Admitting: Family Medicine

## 2020-06-12 VITALS — BP 120/70 | HR 64 | Ht 60.0 in | Wt 133.0 lb

## 2020-06-12 DIAGNOSIS — M999 Biomechanical lesion, unspecified: Secondary | ICD-10-CM

## 2020-06-12 DIAGNOSIS — M940 Chondrocostal junction syndrome [Tietze]: Secondary | ICD-10-CM | POA: Diagnosis not present

## 2020-06-12 NOTE — Progress Notes (Signed)
[name] [address] [place] [address] Phone: [phone] Subjective:   I Jaclyn [name] am serving as a Neurosurgeon for Dr. Antoine Primas.  This visit occurred during the SARS-CoV-2 public health emergency.  Safety protocols were in place, including screening questions prior to the visit, additional usage of staff PPE, and extensive cleaning of exam room while observing appropriate contact time as indicated for disinfecting solutions.   I'm seeing this patient by the request  of:  Patient, No Pcp Per  CC: Back and neck pain follow-up  [id number]  Jessicca Stitzer is a 44 y.o. female coming in with complaint of back and neck pain. OMT 05/16/2020. Patient states she is doing well.   Medications patient has been prescribed: Naproxen  Taking: Intermittently         Reviewed prior external information including notes and imaging from previsou exam, outside providers and external EMR if available.   As well as notes that were available from care everywhere and other healthcare systems.  Past medical history, social, surgical and family history all reviewed in electronic medical record.  No pertanent information unless stated regarding to the chief complaint.   Past Medical History:  Diagnosis Date  . Allergy   . Asthma   . Chicken pox   . GERD (gastroesophageal reflux disease)     No Known Allergies   Review of Systems:  No headache, visual changes, nausea, vomiting, diarrhea, constipation, dizziness, abdominal pain, skin rash, fevers, chills, night sweats, weight loss, swollen lymph nodes, body aches, joint swelling, chest pain, shortness of breath, mood changes. POSITIVE muscle aches  Objective  Blood pressure 120/70, pulse 64, height 5' (1.524 m), weight 133 lb (60.3 kg), SpO2 99 %.   General: No apparent distress alert and oriented x3 mood and affect normal, dressed appropriately.  HEENT: Pupils equal, extraocular movements  intact  Respiratory: Patient's speak in full sentences and does not appear short of breath  Cardiovascular: No lower extremity edema, non tender, no erythema  Neuro: Cranial nerves II through XII are intact, neurovascularly intact in all extremities with 2+ DTRs and 2+ pulses.  Gait normal with good balance and coordination.  MSK:  Non tender with full range of motion and good stability and symmetric strength and tone of shoulders, elbows, wrist, hip, knee and ankles bilaterally.  Back - Normal skin, Spine with normal alignment and no deformity.  No tenderness to vertebral process palpation.  Paraspinous muscles are not tender and without spasm.   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  Follow-up again in 6 to 8 weeks   Nonallopathic problems  Decision today to treat with OMT was based on Physical Exam  After verbal consent patient was treated with HVLA, ME, FPR techniques in cervical, rib, thoracic, lumbar, and sacral  areas  Patient tolerated the procedure well with improvement in symptoms  Patient given exercises, stretches and lifestyle modifications  See medications in patient instructions if given  Patient will follow up  in 4-8 weeks      The above documentation has been reviewed and is accurate and complete [name], DO       Note: This dictation was prepared with Dragon dictation along with smaller phrase technology. Any transcriptional errors that result from this process are unintentional.

## 2020-06-12 NOTE — Assessment & Plan Note (Signed)
Stable at the moment.  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  Follow-up again in 6 to 8 weeks

## 2020-07-31 ENCOUNTER — Other Ambulatory Visit: Payer: Self-pay

## 2020-07-31 ENCOUNTER — Encounter: Payer: Self-pay | Admitting: Family Medicine

## 2020-07-31 ENCOUNTER — Ambulatory Visit (INDEPENDENT_AMBULATORY_CARE_PROVIDER_SITE_OTHER): Payer: 59 | Admitting: Family Medicine

## 2020-07-31 VITALS — BP 102/74 | HR 72 | Ht 60.0 in | Wt 132.0 lb

## 2020-07-31 DIAGNOSIS — M940 Chondrocostal junction syndrome [Tietze]: Secondary | ICD-10-CM | POA: Diagnosis not present

## 2020-07-31 DIAGNOSIS — M999 Biomechanical lesion, unspecified: Secondary | ICD-10-CM | POA: Diagnosis not present

## 2020-07-31 MED ORDER — VITAMIN D (ERGOCALCIFEROL) 1.25 MG (50000 UNIT) PO CAPS: 50000.0000 [IU] | ORAL_CAPSULE | ORAL | 0 refills | Status: DC

## 2020-07-31 NOTE — Assessment & Plan Note (Signed)
Chronic problem with mild exacerbation.  Patient wants to try once weekly vitamin D, warned of potential side effects, discussed using that for another 12 weeks.  Increase activity slowly.  Discussed icing regimen and home exercise, follow-up again in 4 to 8 weeks

## 2020-07-31 NOTE — Progress Notes (Signed)
[name] [address] [place] [address] Phone: [phone] Subjective:   Jaclyn [name], am serving as a scribe for Dr. Antoine Primas. This visit occurred during the SARS-CoV-2 public health emergency.  Safety protocols were in place, including screening questions prior to the visit, additional usage of staff PPE, and extensive cleaning of exam room while observing appropriate contact time as indicated for disinfecting solutions.   I'm seeing this patient by the request  of:  Patient, No Pcp Per  CC: Back pain follow-up  [id number]  Jaclyn [name] is a 44 y.o. female coming in with complaint of back and neck pain. OMT 06/12/2020. Patient states continues have some mild discomfort and pain.  Nothing severe.  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  Still feels like she does not have quite the energy for since she had Covid but still having more good days than bad days          Reviewed prior external information including notes and imaging from previsou exam, outside providers and external EMR if available.   As well as notes that were available from care everywhere and other healthcare systems.  Past medical history, social, surgical and family history all reviewed in electronic medical record.  No pertanent information unless stated regarding to the chief complaint.   Past Medical History:  Diagnosis Date  . Allergy   . Asthma   . Chicken pox   . GERD (gastroesophageal reflux disease)     No Known Allergies   Review of Systems:  No headache, visual changes, nausea, vomiting, diarrhea, constipation, dizziness, abdominal pain, skin rash, fevers, chills, night sweats, weight loss, swollen lymph nodes, body aches, joint swelling, chest pain, shortness of breath, mood changes. POSITIVE muscle aches  Objective  Blood pressure 102/74, pulse 72, height 5' (1.524 m), weight 132 lb (59.9 kg), SpO2 98 %.     General: No apparent distress alert and oriented x3 mood and affect normal, dressed appropriately.  HEENT: Pupils equal, extraocular movements intact  Respiratory: Patient's speak in full sentences and does not appear short of breath  Cardiovascular: No lower extremity edema, non tender, no erythema  Neuro: Cranial nerves II through XII are intact, neurovascularly intact in all extremities with 2+ DTRs and 2+ pulses.  Gait normal with good balance and coordination.  MSK:  Non tender with full range of motion and good stability and symmetric strength and tone of shoulders, elbows, wrist, hip, knee and ankles bilaterally.  Mild hypermobility Back -patient's thoracic area on the right side still has what appears to be more of a slipped rib.  Patient does have some tenderness in this area.  Low back pain mild improvement in range of motion but still has stiffness of the hip flexors.  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  Patient wants to try once weekly vitamin D, warned of potential side effects, discussed using that for another 12 weeks.  Increase activity slowly.  Discussed icing regimen and home exercise, follow-up again in 4 to 8 weeks   Nonallopathic problems  Decision today to treat with OMT was based on Physical Exam  After verbal consent patient was  treated with HVLA, ME, FPR techniques in cervical, rib, thoracic, lumbar, and sacral  areas  Patient tolerated the procedure well with improvement in symptoms  Patient given exercises, stretches and lifestyle modifications  See medications in patient instructions if given  Patient will follow up in 4-8 weeks      The above documentation has been reviewed and is  accurate and complete [name], DO       Note: This dictation was prepared with Dragon dictation along with smaller phrase technology. Any transcriptional errors that result from this process are unintentional.

## 2020-07-31 NOTE — Patient Instructions (Addendum)
Once weekly Vit D See me in 6-7 weeks 

## 2020-08-04 ENCOUNTER — Encounter: Payer: Self-pay | Admitting: Family Medicine

## 2020-08-04 ENCOUNTER — Other Ambulatory Visit: Payer: Self-pay

## 2020-08-04 ENCOUNTER — Ambulatory Visit (INDEPENDENT_AMBULATORY_CARE_PROVIDER_SITE_OTHER): Payer: 59 | Admitting: Family Medicine

## 2020-08-04 VITALS — BP 102/68 | HR 98 | Ht 60.0 in | Wt 133.0 lb

## 2020-08-04 DIAGNOSIS — M999 Biomechanical lesion, unspecified: Secondary | ICD-10-CM | POA: Diagnosis not present

## 2020-08-04 DIAGNOSIS — M940 Chondrocostal junction syndrome [Tietze]: Secondary | ICD-10-CM

## 2020-08-04 NOTE — Progress Notes (Signed)
[name] [address] [place] [address] Phone: [phone] Subjective:   Jaclyn [name], am serving as a scribe for Dr. Antoine Primas. This visit occurred during the SARS-CoV-2 public health emergency.  Safety protocols were in place, including screening questions prior to the visit, additional usage of staff PPE, and extensive cleaning of exam room while observing appropriate contact time as indicated for disinfecting solutions.   I'm seeing this patient by the request  of:  Patient, No Pcp Per  CC: Neck and back pain follow-up  [id number]  Jaclyn [name] is a 44 y.o. female coming in with complaint of back and neck pain. OMT 07/31/2020. Patient states that the rib is out on right side.  Patient feels like the last manipulation was beneficial but then this weekend something slipped.          Reviewed prior external information including notes and imaging from previsou exam, outside providers and external EMR if available.   As well as notes that were available from care everywhere and other healthcare systems.  Past medical history, social, surgical and family history all reviewed in electronic medical record.  No pertanent information unless stated regarding to the chief complaint.   Past Medical History:  Diagnosis Date  . Allergy   . Asthma   . Chicken pox   . GERD (gastroesophageal reflux disease)     No Known Allergies   Review of Systems:  No headache, visual changes, nausea, vomiting, diarrhea, constipation, dizziness, abdominal pain, skin rash, fevers, chills, night sweats, weight loss, swollen lymph nodes, body aches, joint swelling, chest pain, shortness of breath, mood changes. POSITIVE muscle aches  Objective  Blood pressure 102/68, pulse 98, height 5' (1.524 m), weight 133 lb (60.3 kg), SpO2 97 %.   General: No apparent distress alert and oriented x3 mood and affect normal, dressed appropriately.  HEENT:  Pupils equal, extraocular movements intact  Respiratory: Patient's speak in full sentences and does not appear short of breath  Cardiovascular: No lower extremity edema, non tender, no erythema  Neuro: Cranial nerves II through XII are intact, neurovascularly intact in all extremities with 2+ DTRs and 2+ pulses.  Gait normal with good balance and coordination.  MSK:  Non tender with full range of motion and good stability and symmetric strength and tone of shoulders, elbows, wrist, hip, knee and ankles bilaterally.  Back -mild increase in tightness noted in the paraspinal musculature right greater than left.  Patient does have what appears to be more of a slipped rib again on the right side.  Tender to palpation in this area.  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  Discussed the naproxen and taking it more regularly for now.  We discussed the osteopathic manipulation.  Patient did respond.  Hopefully will continue to stay in more regular.  Follow-up with me as scheduled already    Nonallopathic problems  Decision today to treat with OMT was based on Physical Exam  After verbal consent patient was treated with HVLA, ME, FPR techniques in cervical, rib, thoracic, lumbar, and sacral  areas  Patient tolerated the procedure well with improvement in symptoms  Patient given exercises, stretches and lifestyle modifications  See medications in patient instructions if given  Patient will follow  up in 4-8 weeks      The above documentation has been reviewed and is accurate and complete [name], DO       Note: This dictation was prepared with Dragon dictation along with smaller phrase technology. Any transcriptional errors that result from this process are unintentional.

## 2020-08-04 NOTE — Patient Instructions (Signed)
Rigid soled shoes Keep other appointment

## 2020-08-04 NOTE — Assessment & Plan Note (Signed)
Chronic problem with exacerbation.  Discussed the naproxen and taking it more regularly for now.  We discussed the osteopathic manipulation.  Patient did respond.  Hopefully will continue to stay in more regular.  Follow-up with me as scheduled already

## 2020-08-07 ENCOUNTER — Telehealth: Payer: Self-pay | Admitting: Family Medicine

## 2020-08-11 NOTE — Telephone Encounter (Signed)
error 

## 2020-09-11 ENCOUNTER — Other Ambulatory Visit: Payer: Self-pay

## 2020-09-11 ENCOUNTER — Encounter: Payer: Self-pay | Admitting: Family Medicine

## 2020-09-11 ENCOUNTER — Ambulatory Visit (INDEPENDENT_AMBULATORY_CARE_PROVIDER_SITE_OTHER): Payer: 59 | Admitting: Family Medicine

## 2020-09-11 VITALS — BP 104/64 | HR 75 | Ht 60.0 in

## 2020-09-11 DIAGNOSIS — K219 Gastro-esophageal reflux disease without esophagitis: Secondary | ICD-10-CM | POA: Diagnosis not present

## 2020-09-11 DIAGNOSIS — R634 Abnormal weight loss: Secondary | ICD-10-CM

## 2020-09-11 DIAGNOSIS — M999 Biomechanical lesion, unspecified: Secondary | ICD-10-CM

## 2020-09-11 DIAGNOSIS — M940 Chondrocostal junction syndrome [Tietze]: Secondary | ICD-10-CM

## 2020-09-11 NOTE — Assessment & Plan Note (Signed)
Patient continues to have difficulty.  Recently though is having more GI discomfort.  Concern for potential viscerosomatic.  At this time I would like patient to see gastroenterology to see if anything else could be done.  Encourage icing regimen and home exercise, encourage increasing activity slowly.  Follow-up again 6 weeks

## 2020-09-11 NOTE — Progress Notes (Signed)
[name] [address] [place] [address] Phone: [phone] Subjective:   Jaclyn [name], am serving as a scribe for Dr. Antoine Primas. This visit occurred during the SARS-CoV-2 public health emergency.  Safety protocols were in place, including screening questions prior to the visit, additional usage of staff PPE, and extensive cleaning of exam room while observing appropriate contact time as indicated for disinfecting solutions.   I'm seeing this patient by the request  of:  Patient, No Pcp Per  CC: Upper back pain follow-up  [id number]  Jaclyn [name] is a 44 y.o. female coming in with complaint of back and neck pain. OMT 08/04/2020. Patient states overall having some more issues with her stomach that seems to give her more trouble with her shoulder and upper back.  Continues to work fairly regularly.  Medications patient has been prescribed: Vitamin D  Taking: Yes         Reviewed prior external information including notes and imaging from previsou exam, outside providers and external EMR if available.   As well as notes that were available from care everywhere and other healthcare systems.  Past medical history, social, surgical and family history all reviewed in electronic medical record.  No pertanent information unless stated regarding to the chief complaint.   Past Medical History:  Diagnosis Date  . Allergy   . Asthma   . Chicken pox   . GERD (gastroesophageal reflux disease)     No Known Allergies   Review of Systems:  No headache, visual changes, nausea, vomiting, diarrhea, constipation, dizziness, , skin rash, fevers, chills, night sweats, weight loss, swollen lymph nodes,, joint swelling, chest pain, shortness of breath, mood changes. POSITIVE muscle aches, body aches, abdominal pain Objective  There were no vitals taken for this visit.   General: No apparent distress alert and oriented x3 mood and affect  normal, dressed appropriately.  HEENT: Pupils equal, extraocular movements intact  Respiratory: Patient's speak in full sentences and does not appear short of breath  Cardiovascular: No lower extremity edema, non tender, no erythema  Neuro: Cranial nerves II through XII are intact, neurovascularly intact in all extremities with 2+ DTRs and 2+ pulses.  Gait normal with good balance and coordination.  MSK: Mild diffuse tenderness noted.  Hypermobility noted to multiple joints Back - Normal skin, Spine with normal alignment and no deformity.  No tenderness to vertebral process palpation.  Paraspinous muscles are not tender and without spasm.   Range of motion is full at neck and lumbar sacral regions  Osteopathic findings  C2 flexed rotated and side bent right C6 flexed rotated and side bent left T3 extended rotated and side bent right inhaled rib T9 extended rotated and side bent left L2 flexed rotated and side bent right Sacrum right on right       Assessment and Plan: Gastroesophageal reflux disease-refer to gastroenterology  Slipped rib syndrome Patient continues to have difficulty.  Recently though is having more GI discomfort.  Concern for potential viscerosomatic.  At this time I would like patient to see gastroenterology to see if anything else could be done.  Encourage icing regimen and home exercise, encourage increasing activity slowly.  Follow-up again 6 weeks   Nonallopathic problems  Decision today to treat with OMT was based on Physical Exam  After verbal consent patient was treated with HVLA, ME, FPR techniques in cervical, rib, thoracic, lumbar, and sacral  areas  Patient tolerated the procedure well with improvement  in symptoms  Patient given exercises, stretches and lifestyle modifications  See medications in patient instructions if given  Patient will follow up in 4-8 weeks      The above documentation has been reviewed and is accurate and complete  Jaclyn Saa, DO       Note: This dictation was prepared with Dragon dictation along with smaller phrase technology. Any transcriptional errors that result from this process are unintentional.

## 2020-09-11 NOTE — Patient Instructions (Addendum)
Good to see you Referral to GI Rowing machine but use your legs See me again in 6-7 weeks

## 2020-10-15 NOTE — Progress Notes (Signed)
[name] [address] [place] [address] Phone: [phone] Subjective:   I Jaclyn [name] am serving as a Neurosurgeon for Dr. Antoine Primas.  This visit occurred during the SARS-CoV-2 public health emergency.  Safety protocols were in place, including screening questions prior to the visit, additional usage of staff PPE, and extensive cleaning of exam room while observing appropriate contact time as indicated for disinfecting solutions.   I'm seeing this patient by the request  of:  Patient, No Pcp Per  CC: Upper back pain follow-up  [id number]  Jaclyn [name] is a 44 y.o. female coming in with complaint of back and neck pain. OMT 09/11/2020. Patient states she feels dislocated.  Patient has had slipped rib multiple times.  Patient continues to have some reflux but is only she takes the omeprazole seems to be doing well.  Has not followed up with gastroenterology yet.  Patient has started a rowing machine and feels like it is beneficial but it does feel like it may have caused some mild popping out of some of the ribs.  Medications patient has been prescribed: Vit D  Taking:         Reviewed prior external information including notes and imaging from previsou exam, outside providers and external EMR if available.   As well as notes that were available from care everywhere and other healthcare systems.  Past medical history, social, surgical and family history all reviewed in electronic medical record.  No pertanent information unless stated regarding to the chief complaint.   Past Medical History:  Diagnosis Date  . Allergy   . Asthma   . Chicken pox   . GERD (gastroesophageal reflux disease)     No Known Allergies   Review of Systems:  No headache, visual changes, nausea, vomiting, diarrhea, constipation, dizziness, abdominal pain, skin rash, fevers, chills, night sweats, weight loss, swollen lymph nodes, body aches, joint  swelling, chest pain, shortness of breath, mood changes. POSITIVE muscle aches  Objective  Blood pressure 104/60, pulse 62, height 5' (1.524 m), weight 136 lb (61.7 kg), SpO2 98 %.   General: No apparent distress alert and oriented x3 mood and affect normal, dressed appropriately.  HEENT: Pupils equal, extraocular movements intact  Respiratory: Patient's speak in full sentences and does not appear short of breath  Cardiovascular: No lower extremity edema, non tender, no erythema   Gait normal with good balance and coordination.  MSK:  Non tender with full range of motion and good stability and symmetric strength and tone of shoulders, elbows, wrist, hip, knee and ankles bilaterally.  Hypermobility noted of some joints Back -upper back still has significant tenderness noted.  More tenderness on the right sided but  T3 in the parascapular area.  No spinous process tenderness.  Patient has some pain in the T5 on the left as well  Osteopathic findings   C6 flexed rotated and side bent left T3 extended rotated and side bent right inhaled rib T5 extended rotated and side bent left inhaled rib L1 flexed rotated and side bent right Sacrum right on right       Assessment and Plan:  Slipped rib syndrome Continues to have difficulty.  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  I do feel that the rowing will be good but we discussed different posture and ergonomics with this that I think will be helpful.  Responds well though to osteopathic manipulation.  Patient does have the  naproxen if necessary.  Follow-up with me again in 4 to 8 weeks    Nonallopathic problems  Decision today to treat with OMT was based on Physical Exam  After verbal consent patient was treated with HVLA, ME, FPR techniques in cervical, rib, thoracic, lumbar, and sacral  areas  Patient tolerated the procedure well with improvement in symptoms  Patient given exercises, stretches and lifestyle  modifications  See medications in patient instructions if given  Patient will follow up in 4-8 weeks      The above documentation has been reviewed and is accurate and complete [name], DO       Note: This dictation was prepared with Dragon dictation along with smaller phrase technology. Any transcriptional errors that result from this process are unintentional.

## 2020-10-16 ENCOUNTER — Ambulatory Visit (INDEPENDENT_AMBULATORY_CARE_PROVIDER_SITE_OTHER): Payer: 59 | Admitting: Family Medicine

## 2020-10-16 ENCOUNTER — Other Ambulatory Visit: Payer: Self-pay

## 2020-10-16 ENCOUNTER — Encounter: Payer: Self-pay | Admitting: Family Medicine

## 2020-10-16 VITALS — BP 104/60 | HR 62 | Ht 60.0 in | Wt 136.0 lb

## 2020-10-16 DIAGNOSIS — M999 Biomechanical lesion, unspecified: Secondary | ICD-10-CM | POA: Diagnosis not present

## 2020-10-16 DIAGNOSIS — M940 Chondrocostal junction syndrome [Tietze]: Secondary | ICD-10-CM | POA: Diagnosis not present

## 2020-10-16 NOTE — Patient Instructions (Signed)
Good to see you See me again in  

## 2020-10-16 NOTE — Assessment & Plan Note (Signed)
Continues to have difficulty.  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  I do feel that the rowing will be good but we discussed different posture and ergonomics with this that I think will be helpful.  Responds well though to osteopathic manipulation.  Patient does have the naproxen if necessary.  Follow-up with me again in 4 to 8 weeks

## 2020-10-30 NOTE — Progress Notes (Unsigned)
[name] [address] [place] [address] Phone: [phone] Subjective:   Jaclyn [name], am serving as a scribe for Dr. Antoine Primas. This visit occurred during the SARS-CoV-2 public health emergency.  Safety protocols were in place, including screening questions prior to the visit, additional usage of staff PPE, and extensive cleaning of exam room while observing appropriate contact time as indicated for disinfecting solutions.   I'm seeing this patient by the request  of:  Patient, No Pcp Per  CC: Rib pain follow-up  [id number]  Jaclyn [name] is a 45 y.o. female coming in with complaint of back and neck pain. OMT 10/16/2020. Patient states that she had pain that was radiating in arm and in the neck. Used yoga wheel for pain. Patient states today feeling significantly better but it was severe. Patient has not had it this severe in quite some time. Did even take an Aleve which usually does upset her stomach. Patient states that he is feeling much better at this point. Though has been the biggest change quickly than she has had.  Medications patient has been prescribed: Vit D  Taking: Yes    Patient did have x-rays taken July 2020. Do not have the imaging but x-rays of the patient's chest did show a 1.6 cm lucency of the right lateral sixth rib but this was present on imaging in August 2011 as well.      Reviewed prior external information including notes and imaging from previsou exam, outside providers and external EMR if available.   As well as notes that were available from care everywhere and other healthcare systems.  Past medical history, social, surgical and family history all reviewed in electronic medical record.  No pertanent information unless stated regarding to the chief complaint.   Past Medical History:  Diagnosis Date  . Allergy   . Asthma   . Chicken pox   . GERD (gastroesophageal reflux disease)     No Known  Allergies   Review of Systems:  No headache, visual changes, nausea, vomiting, diarrhea, constipation, dizziness, abdominal pain, skin rash, fevers, chills, night sweats, weight loss, swollen lymph nodes, joint swelling, chest pain, shortness of breath, mood changes. POSITIVE muscle aches, body aches  Objective  Blood pressure 100/62, height 5' (1.524 m), weight 135 lb (61.2 kg), last menstrual period 10/17/2020.   General: No apparent distress alert and oriented x3 mood and affect normal, dressed appropriately.  HEENT: Pupils equal, extraocular movements intact  Respiratory: Patient's speak in full sentences and does not appear short of breath  Cardiovascular: No lower extremity edema, non tender, no erythema  Neuro: Cranial nerves II through XII are intact, neurovascularly intact in all extremities with 2+ DTRs and 2+ pulses.  Gait normal with good balance and coordination.  MSK:  Non tender with full range of motion and good stability and symmetric strength and tone of shoulders, elbows, wrist, hip, knee and ankles bilaterally.  Neck exam unremarkable at this time. Patient though with her shoulder movements with resisted adduction of his pain over the sternum at this point. Patient also nontender to palpation in the parascapular region more on the T5-T6 which is lower than patient usually has a discomfort and pain. No palpable step-off noted. Mild tender over the lateral side of the rib as well.        Assessment and Plan:         The above documentation has been reviewed and is accurate and complete  Jaclyn Pulley, DO       Note: This dictation was prepared with Dragon dictation along with smaller phrase technology. Any transcriptional errors that result from this process are unintentional.

## 2020-10-31 ENCOUNTER — Other Ambulatory Visit: Payer: Self-pay

## 2020-10-31 ENCOUNTER — Ambulatory Visit (INDEPENDENT_AMBULATORY_CARE_PROVIDER_SITE_OTHER): Payer: 59 | Admitting: Family Medicine

## 2020-10-31 ENCOUNTER — Ambulatory Visit (INDEPENDENT_AMBULATORY_CARE_PROVIDER_SITE_OTHER): Payer: 59

## 2020-10-31 ENCOUNTER — Encounter: Payer: Self-pay | Admitting: Family Medicine

## 2020-10-31 VITALS — BP 100/62 | Ht 60.0 in | Wt 135.0 lb

## 2020-10-31 DIAGNOSIS — M542 Cervicalgia: Secondary | ICD-10-CM

## 2020-10-31 DIAGNOSIS — M546 Pain in thoracic spine: Secondary | ICD-10-CM

## 2020-10-31 DIAGNOSIS — M940 Chondrocostal junction syndrome [Tietze]: Secondary | ICD-10-CM | POA: Diagnosis not present

## 2020-10-31 DIAGNOSIS — R0781 Pleurodynia: Secondary | ICD-10-CM

## 2020-10-31 MED ORDER — METHYLPREDNISOLONE ACETATE 40 MG/ML IJ SUSP
40.0000 mg | Freq: Once | INTRAMUSCULAR | Status: AC
  Administered 2020-10-31: 40 mg via INTRAMUSCULAR

## 2020-10-31 MED ORDER — KETOROLAC TROMETHAMINE 30 MG/ML IJ SOLN
30.0000 mg | Freq: Once | INTRAMUSCULAR | Status: AC
  Administered 2020-10-31: 30 mg via INTRAMUSCULAR

## 2020-10-31 MED ORDER — TIZANIDINE HCL 2 MG PO CAPS: 2.0000 mg | ORAL_CAPSULE | Freq: Three times a day (TID) | ORAL | 1 refills | Status: DC | PRN

## 2020-10-31 NOTE — Patient Instructions (Addendum)
K2 daily for 4 weeks Zanaflex 2mg  at night We will talk about imaging once I speak with radiologist

## 2020-10-31 NOTE — Assessment & Plan Note (Signed)
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 Has been over 2 weeks since we have seen her last and was doing well. Patient has been rowing and only had acute pain starting yesterday. Patient does have an x-ray from previous year we do not have the pictures but read did show a lucency of the sixth rib. Past medical history significant for a fracture that was significant by her chiropractor years ago. I would like to get repeat x-rays today to further evaluate. Toradol and Depo-Medrol given today. Zanaflex at night okay and prescription given. Due to patient severity that it was fairly significant yesterday and today's improvement but still sore will hold on any type of manipulation. Patient has follow-up in 1 month

## 2020-11-03 ENCOUNTER — Encounter: Payer: Self-pay | Admitting: Family Medicine

## 2020-11-03 ENCOUNTER — Encounter: Payer: Self-pay | Admitting: *Deleted

## 2020-11-04 ENCOUNTER — Encounter: Payer: Self-pay | Admitting: Internal Medicine

## 2020-11-04 ENCOUNTER — Ambulatory Visit: Payer: 59 | Admitting: Internal Medicine

## 2020-11-04 VITALS — BP 110/62 | HR 75 | Ht 66.0 in | Wt 136.0 lb

## 2020-11-04 DIAGNOSIS — R1013 Epigastric pain: Secondary | ICD-10-CM | POA: Diagnosis not present

## 2020-11-04 DIAGNOSIS — K219 Gastro-esophageal reflux disease without esophagitis: Secondary | ICD-10-CM | POA: Diagnosis not present

## 2020-11-04 DIAGNOSIS — R0789 Other chest pain: Secondary | ICD-10-CM | POA: Diagnosis not present

## 2020-11-04 DIAGNOSIS — Z1211 Encounter for screening for malignant neoplasm of colon: Secondary | ICD-10-CM

## 2020-11-04 NOTE — Progress Notes (Signed)
Patient ID: Jaclyn [name], female   DOB: [birth year], 45 y.o.   MRN: [id number]  HPI: Jaclyn [name] is a 45 year old female with a past medical history of GERD, IBS, gluten intolerance, asthma, history of anxiety who is seen in consult at the request of Dr. [name] to evaluate reflux, atypical chest pain as well as stomach discomfort.  She is here today with her husband.  She reports that her GI symptoms seem to occur around and after April 2010 when she had a large splinter lodged in her foot.  This led to "septic shock" as well as because the wound was treated and arsenic exposure.  She was treated with multiple rounds of antibiotics including 6 weeks of clindamycin and several weeks of Levaquin.  She later developed a secondary infection again requiring antibiotics.  She reports that this whole experience "destroyed my stomach".  She becomes quite emotional and tearful when talking about this experience.  She was seen by Dr. Merri Brunette at [facility] and managed for several years.  She reports that she has considerable food intolerances and avoids wheat strictly.  She avoids foods that contain dyes, fruits and some chicken.  During the period of severe illness in 2010 she lost 40 pounds and had significant diarrhea.  She reports having been tested for C. difficile but this was never diagnosed.  She did have heartburn and indigestion symptoms during this time was treated with Nexium 40 mg twice daily for years and Bentyl 4 times a day.  Most recently she has been taking Nexium 40 mg/day but she has frequent heartburn symptoms.  This is associated with belching and pressure in her chest.  Pills will sometimes stick with swallowing but no solid food or liquid food dysphagia.  The Nexium does seem to help the symptoms.  She does not have abdominal pain.  Her bowel movements are mostly regular but can occur 2-3 times per day.  They are mostly formed though she can occasionally have diarrhea.  She recalls taking  glutathione for some time which seemed to help her diarrhea.  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  She has never had an upper endoscopy or colonoscopy.  She denies a family history of celiac disease.  Her paternal grandmother had esophagus cancer.  She is divorced and has 1 son.  She works as a Teacher, adult education.  No alcohol or illicit drug use.  No tobacco use.  Past Medical History:  Diagnosis Date  . Allergy   . Anxiety   . Asthma   . Chicken pox   . GERD (gastroesophageal reflux disease)   . IBS (irritable bowel syndrome)   . Pneumonia 2015    Past Surgical History:  Procedure Laterality Date  . FOOT SURGERY      Outpatient Medications Prior to Visit  Medication Sig Dispense Refill  . fluticasone (FLONASE) 50 MCG/ACT nasal spray Place 1 spray into both nostrils daily.    Marland Kitchen MAGNESIUM CITRATE PO Take by mouth daily. 3/4 tsp in 1 liter of water    . naproxen (NAPROSYN) 500 MG tablet Take 1 tablet (500 mg total) by mouth 2 (two) times daily with a meal. (Patient taking differently: Take 500 mg by mouth as needed.) 30 tablet 0  . tizanidine (ZANAFLEX) 2 MG capsule Take 1 capsule (2 mg total) by mouth 3 (three) times daily as needed for muscle spasms. 30 capsule 1  . vitamin B-12 (CYANOCOBALAMIN) 250 MCG tablet Take 250 mcg by mouth daily.    Marland Kitchen  Vitamin D, Ergocalciferol, (DRISDOL) 1.25 MG (50000 UNIT) CAPS capsule Take 1 capsule (50,000 Units total) by mouth every 7 (seven) days. 12 capsule 0  . Vitamin D, Ergocalciferol, (DRISDOL) 1.25 MG (50000 UNIT) CAPS capsule Take 1 capsule (50,000 Units total) by mouth every 7 (seven) days. 12 capsule 0   No facility-administered medications prior to visit.    No Known Allergies  Family History  Problem Relation Age of Onset  . Alcohol abuse Mother   . Cancer Mother   . Heart disease Mother   . Multiple sclerosis Mother   . Alcohol abuse Father   . Heart disease Father   . Bladder Cancer Father   . Multiple sclerosis  Brother   . Lung cancer Paternal Grandmother   . Esophageal cancer Paternal Grandmother   . Colon cancer Neg Hx     Social History   Tobacco Use  . Smoking status: Never Smoker  . Smokeless tobacco: Never Used  Vaping Use  . Vaping Use: Never used  Substance Use Topics  . Alcohol use: No    Alcohol/week: 0.0 standard drinks  . Drug use: No    ROS: As per history of present illness, otherwise negative  Ht 5\' 6"  (1.676 m)   Wt 136 lb (61.7 kg)   LMP 10/31/2020 (Exact Date)   BMI 21.95 kg/m  Constitutional: Well-developed and well-nourished. No distress. HEENT: Normocephalic and atraumatic. Conjunctivae are normal.  No scleral icterus. Cardiovascular: Normal rate, regular rhythm and intact distal pulses. No M/R/G Pulmonary/chest: Effort normal and breath sounds normal. No wheezing, rales or rhonchi. Abdominal: Soft, epigastric tenderness without rebound or guarding, nondistended. Bowel sounds active throughout. There are no masses palpable. No hepatosplenomegaly. Extremities: no clubbing, cyanosis, or edema Neurological: Alert and oriented to person place and time. Skin: Skin is warm and dry.  Psychiatric: Normal mood and affect. Behavior is normal.   ASSESSMENT/PLAN: 45 year old female with a past medical history of GERD, IBS, gluten intolerance, asthma, history of anxiety who is seen in consult at the request of Dr. 49 to evaluate reflux, atypical chest pain as well as stomach discomfort.   1. GERD/atypical chest pain/belching/epigastric pain --patient has symptoms of acid reflux disease but also dyspepsia.  She is on Nexium 40 mg daily and has been treated with PPI on and off over the last 10+ years.  She has never had an upper endoscopy.  I have recommended an upper endoscopy for evaluation of GERD and to further evaluate her abdominal pain.  If the exam is normal given persistent symptoms I would recommend we pursue Bravo capsule testing to objectively evaluate for  uncontrolled acid reflux on Nexium 40 mg daily. -- EGD for evaluation of GERD with Bravo placement if normal -- Continue Nexium 40 mg daily -- It is also come to my attention that she has an MRI scheduled with Dr. [name], MRI needs to be done before Bravo placement  2. gluten intolerance --she reports she was tested for celiac disease and reportedly this was negative by blood test however she follows a strict gluten-free diet.  We will examine the small intestine at the time of upper endoscopy as discussed above  3. CRC screening --colonoscopy recommended for screening.  We discussed the guidelines now which support screening after age 60.  She was unaware of this new recommendation.  She reports that she is not prepared to pursue colonoscopy at this time.  She will consider this further.  She is aware of my recommendation.  XN:ATFTD, [name], Do [address] [place],  [place] [address]

## 2020-11-04 NOTE — Patient Instructions (Signed)
You have been scheduled for an endoscopy with BRAVO. Please follow written instructions given to you at your visit today. If you use inhalers (even only as needed), please bring them with you on the day of your procedure. _____________________________________________________  Continue Nexium. ______________________________________________________ If you are age 47 or older, your body mass index should be between 23-30. Your Body mass index is 21.95 kg/m. If this is out of the aforementioned range listed, please consider follow up with your Primary Care Provider.  If you are age 15 or younger, your body mass index should be between 19-25. Your Body mass index is 21.95 kg/m. If this is out of the aformentioned range listed, please consider follow up with your Primary Care Provider.  _______________________________________________________ Due to recent changes in healthcare laws, you may see the results of your imaging and laboratory studies on MyChart before your provider has had a chance to review them.  We understand that in some cases there may be results that are confusing or concerning to you. Not all laboratory results come back in the same time frame and the provider may be waiting for multiple results in order to interpret others.  Please give [place] 48 hours in order for your provider to thoroughly review all the results before contacting the office for clarification of your results.

## 2020-11-07 ENCOUNTER — Telehealth: Payer: Self-pay | Admitting: Family Medicine

## 2020-11-07 ENCOUNTER — Telehealth: Payer: Self-pay | Admitting: *Deleted

## 2020-11-07 NOTE — Telephone Encounter (Signed)
I have attempted to reschedule patient's MRI spine to an earlier date than 11/17/18 so that she may have this prior to EGD Bravo (she is unable to have MRI within 30 days of having BRAVO placed). Unfortunately, I was advised by [facility] that since no pre certification had taken place yet for the MRI, we could not do any rescheduling until that was completed. I reached out to Dr Timothy Lasso Jaclyn [name]'s CMA, Debbe Odea who was working previously on trying to get pre certification on MRI. She indicated that unfortunately, [facility] stated they never received the faxed request for authorization request, therefore it would be another 5-7 days to process the request.  I contacted the patient to advise that unfortunately, I would not be able to move the MRI as insurance has not pre certified it at this time, so instead, we would need to move the endoscopy/BRAVO. Patient instead would like to leave the endoscopy/BRAVO date as is and try to get [facility] to expedite their response for MRI if possible by contacting them herself. If she cannot get MRI approval, she can move MRI to later date.  EGD/Bravo does not need precertification per [facility] as per our precert coordinator, Esmerelda Monegro:   "BH_[email or web address] : No Prior Authorization is required for this service request. While this service may not require prior authorization, please contact Provider Services at [phone] to ensure the service is a covered benefit for the [facility] member. The payment of the member's benefits is subject to using an in-network provider, the member's plan eligibility at the time of service, and the benefit limitations in the [facility] Certificate of Coverage. If there are additional procedure codes included with this request, please click on the Clear button and submit the next procedure code. 3:13:17 PM"

## 2020-11-07 NOTE — Telephone Encounter (Signed)
-----   Message from [name], MD sent at 11/04/2020  5:53 PM EST ----- Need to move the MRI or the Bravo

## 2020-11-07 NOTE — Telephone Encounter (Signed)
Patient called stating that she is scheduled for her endoscopy on Thursday, Jan 20th and the MRI was supposed to be after that. Per GI, she would need to have the MRI prior to the endoscopy. They are waiting for approval from [facility]. I told her that you have been working on it and have faxed the pre cert to them multiple times.  Anything else we can do?  Patient did give me the Prior Authorization number if that is needed [phone]).

## 2020-11-11 NOTE — Telephone Encounter (Signed)
No I have already been in contact with GI and informed them that I have not heard any response from [facility] and when I called Thursday they said it could take 5-7 business days for a response. I am hoping there is something on the fax machine when we get in today.

## 2020-11-13 ENCOUNTER — Encounter: Payer: 59 | Admitting: Internal Medicine

## 2020-11-14 MED ORDER — DIAZEPAM 5 MG PO TABS: ORAL_TABLET | ORAL | 0 refills | Status: AC

## 2020-11-17 ENCOUNTER — Ambulatory Visit
Admission: RE | Admit: 2020-11-17 | Discharge: 2020-11-17 | Disposition: A | Discharge status: Home / Self Care | Payer: 59 | Source: Ambulatory Visit | Attending: Family Medicine | Admitting: Family Medicine

## 2020-11-17 ENCOUNTER — Other Ambulatory Visit: Payer: Self-pay

## 2020-11-17 DIAGNOSIS — M546 Pain in thoracic spine: Secondary | ICD-10-CM

## 2020-11-17 DIAGNOSIS — R0781 Pleurodynia: Secondary | ICD-10-CM

## 2020-11-17 DIAGNOSIS — M940 Chondrocostal junction syndrome [Tietze]: Secondary | ICD-10-CM

## 2020-11-26 NOTE — Progress Notes (Signed)
[name] Phone: [phone] Subjective:   I Jaclyn [name] am serving as a Neurosurgeon for Dr. Antoine Primas.  This visit occurred during the SARS-CoV-2 public health emergency.  Safety protocols were in place, including screening questions prior to the visit, additional usage of staff PPE, and extensive cleaning of exam room while observing appropriate contact time as indicated for disinfecting solutions.   I'm seeing this patient by the request  of:  Patient, No Pcp Per  CC: Upper back pain follow-up  [id number]  Jaclyn [name] is a 45 y.o. female coming in with complaint of back and neck pain. OMT 10/31/2020. Patient states she is still sore today. Thoracic spine.  Patient is having worsening pain and concern for more of an occult fracture.  MRI of the thoracic spine was done and independently visualized by me showing very minimal degenerative disc disease but otherwise no acute fracture.  Patient states that with 2 weeks off from doing daily activities as well as decreasing the amount of edema supplementations patient is feeling significantly better patient states that still some mild discomfort in the upper back but nothing as severe as it was previously.  Medications patient has been prescribed: Zanaflex  Taking: Not regularly         Reviewed prior external information including notes and imaging from previsou exam, outside providers and external EMR if available.   As well as notes that were available from care everywhere and other healthcare systems.  Past medical history, social, surgical and family history all reviewed in electronic medical record.  No pertanent information unless stated regarding to the chief complaint.   Past Medical History:  Diagnosis Date  . Allergy   . Anxiety   . Asthma   . Chicken pox   . GERD (gastroesophageal reflux disease)   . IBS (irritable bowel syndrome)   .  Pneumonia 2015    No Known Allergies   Review of Systems:  No headache, visual changes, nausea, vomiting, diarrhea, constipation, dizziness,skin rash, fevers, chills, night sweats, weight loss, swollen lymph nodes, joint swelling, chest pain, shortness of breath, mood changes. POSITIVE muscle aches, abdominal pain, body aches  Objective  Last menstrual period 10/31/2020.   General: No apparent distress alert and oriented x3 mood and affect normal, dressed appropriately.  HEENT: Pupils equal, extraocular movements intact  Respiratory: Patient's speak in full sentences and does not appear short of breath  Cardiovascular: No lower extremity edema, non tender, no erythema  Neuro: Cranial nerves II through XII are intact, neurovascularly intact in all extremities with 2+ DTRs and 2+ pulses.  Gait normal with good balance and coordination.  MSK:  Non tender with full range of motion and good stability and symmetric strength and tone of shoulders, elbows, wrist, hip, knee and ankles bilaterally.  Back -back exam still shows some tightness in the parascapular region right greater than left.  Patient does have some tightness noted in this area.  Neck exam also little tender on the right in the occipital region.  Negative Spurling's.  Osteopathic findings  C2 flexed rotated and side bent right T3 extended rotated and side bent right inhaled rib T9 extended rotated and side bent right L2 flexed rotated and side bent right Sacrum right on right       Assessment and Plan:  Slipped rib syndrome Patient had an MRI of the thoracic spine that was unremarkable.  We discussed the potential for a CT  scan of the chest with patient Jaclyn [name] with her making some improvement.  Patient does have some reflux that could be contributing to some of the intermittent pain but patient has stopped some of the supplementations which has helped out as well.  Patient is not going to move through with the endoscopy at  this time.  Patient will continue with the home exercises and we will increase activity slowly.  Refill patient's vitamin D.  Follow-up with me again 4 to 6 weeks    Nonallopathic problems  Decision today to treat with OMT was based on Physical Exam  After verbal consent patient was treated with HVLA, ME, FPR techniques in cervical, rib, thoracic, lumbar, and sacral  areas  Patient tolerated the procedure well with improvement in symptoms  Patient given exercises, stretches and lifestyle modifications  See medications in patient instructions if given  Patient will follow up in 4-8 weeks      The above documentation has been reviewed and is accurate and complete [name], DO       Note: This dictation was prepared with Dragon dictation along with smaller phrase technology. Any transcriptional errors that result from this process are unintentional.

## 2020-11-27 ENCOUNTER — Other Ambulatory Visit: Payer: Self-pay

## 2020-11-27 ENCOUNTER — Encounter: Payer: Self-pay | Admitting: Family Medicine

## 2020-11-27 ENCOUNTER — Ambulatory Visit (INDEPENDENT_AMBULATORY_CARE_PROVIDER_SITE_OTHER): Payer: 59 | Admitting: Family Medicine

## 2020-11-27 VITALS — BP 106/70 | HR 69 | Ht 66.0 in | Wt 136.0 lb

## 2020-11-27 DIAGNOSIS — M940 Chondrocostal junction syndrome [Tietze]: Secondary | ICD-10-CM | POA: Diagnosis not present

## 2020-11-27 DIAGNOSIS — M999 Biomechanical lesion, unspecified: Secondary | ICD-10-CM | POA: Diagnosis not present

## 2020-11-27 MED ORDER — VITAMIN D (ERGOCALCIFEROL) 1.25 MG (50000 UNIT) PO CAPS: 50000.0000 [IU] | ORAL_CAPSULE | ORAL | 0 refills | Status: DC

## 2020-11-27 NOTE — Assessment & Plan Note (Signed)
Patient had an MRI of the thoracic spine that was unremarkable.  We discussed the potential for a CT scan of the chest with patient Jaclyn [name] with her making some improvement.  Patient does have some reflux that could be contributing to some of the intermittent pain but patient has stopped some of the supplementations which has helped out as well.  Patient is not going to move through with the endoscopy at this time.  Patient will continue with the home exercises and we will increase activity slowly.  Refill patient's vitamin D.  Follow-up with me again 4 to 6 weeks

## 2020-11-27 NOTE — Patient Instructions (Addendum)
Good to see you Rowing only 2-3 times a week Give a break from band exercises Vitamin D for 4 weeks only See me again in 4-6 weeks we will consider checking vitamin D

## 2020-11-28 MED ORDER — PREDNISONE 20 MG PO TABS: ORAL_TABLET | ORAL | 0 refills | Status: DC

## 2020-11-28 NOTE — Addendum Note (Signed)
Addended by: [name] on: 11/28/2020 10:14 AM   Modules accepted: Orders

## 2021-01-06 NOTE — Telephone Encounter (Addendum)
I have left a message for patient to call back so that I can ask if she is still interested in rescheduling endoscopy with BRAVO. She has not rescheduled her procedure to date. I will await a return call from her at this point. I will also send her a letter to remind her of need for test.

## 2021-01-06 NOTE — Telephone Encounter (Signed)
Canceled:  11/11/2020 1:34 PM  By:  Serina Cowper  Cancel Rsn: Patient (Has alot going on and wants to proceed with the MRI for now)

## 2021-01-06 NOTE — Telephone Encounter (Signed)
Jaclyn [name] to [name], MD  11/11/20 12:56 PM Good morning. It was a pleasure meeting you and i look forward to figuring out my gi issues with your help. I am scheduled for a Covid test today at 130, endoscopy on Thursday. [name] been working with dottie on trying to get the mri before endoscopy. As of Friday, neither the endoscopy or mri had been approved by my insurance. I called my insurance company Friday and yesterday and got the run around. I understand the weather has thrown everyone behind. I called your office this morning and was hold the first time for 20 minutes. At 30 minutes, someone answered and put me on hold again. I have no idea if I've been approved by my insurance so I am not going to make a trip for a covid test. I was calling for clarification this morning. At this point, i think its best to reschedule the endoscopy when someone can let me know my insurance has approved the procedure. Appreciate your thoughts on this matter

## 2021-01-08 ENCOUNTER — Encounter: Payer: Self-pay | Admitting: Family Medicine

## 2021-01-08 ENCOUNTER — Ambulatory Visit (INDEPENDENT_AMBULATORY_CARE_PROVIDER_SITE_OTHER): Payer: 59 | Admitting: Family Medicine

## 2021-01-08 ENCOUNTER — Other Ambulatory Visit: Payer: Self-pay

## 2021-01-08 DIAGNOSIS — M940 Chondrocostal junction syndrome [Tietze]: Secondary | ICD-10-CM

## 2021-01-08 NOTE — Progress Notes (Signed)
  [name] [address] [place] [address] Phone: [phone] Subjective:   Jaclyn [name], am serving as a scribe for Dr. Antoine Primas. This visit occurred during the SARS-CoV-2 public health emergency.  Safety protocols were in place, including screening questions prior to the visit, additional usage of staff PPE, and extensive cleaning of exam room while observing appropriate contact time as indicated for disinfecting solutions.   I'm seeing this patient by the request  of:  Patient, No Pcp Per  CC: Upper back pain follow-up  [id number]  Jaclyn [name] is a 45 y.o. female coming in with complaint of back and neck pain. OMT 11/27/2020. Patient states that she has been on her bike 2x and walking occasionlly. Pain has not had any sharp pain since last visit.  Patient still has not been working as much as usual.  Patient started doing a little bit of exercises but still concerned about too much pain.  Medications patient has been prescribed: Prednisone, Vit D, Zanaflex          Reviewed prior external information including notes and imaging from previsou exam, outside providers and external EMR if available.   As well as notes that were available from care everywhere and other healthcare systems.  Past medical history, social, surgical and family history all reviewed in electronic medical record.  No pertanent information unless stated regarding to the chief complaint.   Past Medical History:  Diagnosis Date  . Allergy   . Anxiety   . Asthma   . Chicken pox   . GERD (gastroesophageal reflux disease)   . IBS (irritable bowel syndrome)   . Pneumonia 2015    No Known Allergies   Review of Systems:  No headache, visual changes, nausea, vomiting, diarrhea, constipation, dizziness, abdominal pain, skin rash, fevers, chills, night sweats, weight loss, swollen lymph nodes, body aches, joint swelling, chest pain, shortness of breath,  mood changes. POSITIVE muscle aches  Objective  There were no vitals taken for this visit.   General: No apparent distress alert and oriented x3 mood and affect normal, dressed appropriately.  HEENT: Pupils equal, extraocular movements intact  Respiratory: Patient's speak in full sentences and does not appear short of breath  Cardiovascular: No lower extremity edema, non tender, no erythema  Neuro: Cranial nerves II through XII are intact, neurovascularly intact in all extremities with 2+ DTRs and 2+ pulses.  Gait normal with good balance and coordination.  MSK:  Non tender with full range of motion and good stability and symmetric strength and tone of shoulders, elbows, wrist, hip, knee and ankles bilaterally.  Back -upper back seems to be doing a little bit better.  Still has some mild tenderness noted in the paraspinal musculature.  Patient is able to take a deep breath.  Sitting comfortably during the exam.        Assessment and Plan:       The above documentation has been reviewed and is accurate and complete [name], DO       Note: This dictation was prepared with Dragon dictation along with smaller phrase technology. Any transcriptional errors that result from this process are unintentional.

## 2021-01-08 NOTE — Assessment & Plan Note (Signed)
Patient is making significant progress at this time but will hold on any type of manipulation at this time.  Continue to increase activity and patient will do more cycling as well as some more core work.  We will hold on the rowing as well as the manipulation.  We will consider adding that though in 6 weeks.  Patient will follow up again in 6 weeks.

## 2021-01-08 NOTE — Patient Instructions (Signed)
Once you are done with once weekly Vit D change to 5000IU daily Ok to ride more often See me in 6 weeks

## 2021-02-18 NOTE — Progress Notes (Signed)
[name] [address] [place] [address] Phone: [phone] Subjective:   Jaclyn [name], am serving as a scribe for Dr. Antoine Primas. This visit occurred during the SARS-CoV-2 public health emergency.  Safety protocols were in place, including screening questions prior to the visit, additional usage of staff PPE, and extensive cleaning of exam room while observing appropriate contact time as indicated for disinfecting solutions.   I'm seeing this patient by the request  of:  Patient, No Pcp Per (Inactive)  CC: Back pain follow-up  [id number]  Jaclyn [name] is a 45 y.o. female coming in with complaint of back and neck pain. OMT 01/08/2021. Patient states that her L piriformis is tight and painful today otherwise nothing new.  Patient states that the more in the lower back at the moment.  Patient feels though that she still fatigues in the mid back when she is doing multiple exercises in a day.  Patient denies any radiation down the arm or any numbness or tingling.  States that just a localized soreness.  Medications patient has been prescribed: Vit D  Taking:         Past medical history, social, surgical and family history all reviewed in electronic medical record.  No pertanent information unless stated regarding to the chief complaint.   Past Medical History:  Diagnosis Date  . Allergy   . Anxiety   . Asthma   . Chicken pox   . GERD (gastroesophageal reflux disease)   . IBS (irritable bowel syndrome)   . Pneumonia 2015    No Known Allergies   Review of Systems:  No headache, visual changes, nausea, vomiting, diarrhea, constipation, dizziness, abdominal pain, skin rash, fevers, chills, night sweats, weight loss, swollen lymph nodes, body aches, joint swelling, chest pain, shortness of breath, mood changes. POSITIVE muscle aches  Objective  Blood pressure 106/64, pulse 70, height 5\' 6"  (1.676 m), weight 134 lb (60.8 kg),  SpO2 99 %.   General: No apparent distress alert and oriented x3 mood and affect normal, dressed appropriately.  HEENT: Pupils equal, extraocular movements intact  Respiratory: Patient's speak in full sentences and does not appear short of breath  Cardiovascular: No lower extremity edema, non tender, no erythema  Neuro: Cranial nerves II through XII are intact, neurovascularly intact in all extremities with 2+ DTRs and 2+ pulses.  Gait normal with good balance and coordination.  MSK:  Non tender with full range of motion and good stability and symmetric strength and tone of shoulders, elbows, wrist, hip, knee and ankles bilaterally.  Back -more tenderness in the left gluteal area and the left intertrochanteric area.  Mild tightness noted of the piriformis.  Patient is back mild tenderness throughout but seems to be more in the paraspinal musculature around the sacroiliac joint and some over the thoracic spine right greater than left.  Osteopathic findings  C6 flexed rotated and side bent right T3 extended rotated and side bent right inhaled rib T9 extended rotated and side bent left L2 flexed rotated and side bent right Sacrum r left on left       Assessment and Plan:  Low back pain Mild increase in low back pain.  Seems to be more secondary to more of the piriformis on the left side today.  Patient is responding fairly well though to osteopathic manipulation.  Avoided any thoracic spine secondary to the exacerbation patient had previously we discussed icing regimen and home exercises.  Increase activity slowly.  Follow-up again in 4 to 8 weeks    Nonallopathic problems  Decision today to treat with OMT was based on Physical Exam  After verbal consent patient was treated with , ME, FPR techniques in cervical, rib, thoracic, lumbar, and sacral  areas avoided HVLA in the thoracic area  Patient tolerated the procedure well with improvement in symptoms  Patient given exercises,  stretches and lifestyle modifications  See medications in patient instructions if given  Patient will follow up in 4-8 weeks      The above documentation has been reviewed and is accurate and complete [name], DO       Note: This dictation was prepared with Dragon dictation along with smaller phrase technology. Any transcriptional errors that result from this process are unintentional.

## 2021-02-19 ENCOUNTER — Other Ambulatory Visit: Payer: Self-pay

## 2021-02-19 ENCOUNTER — Ambulatory Visit (INDEPENDENT_AMBULATORY_CARE_PROVIDER_SITE_OTHER): Payer: 59 | Admitting: Family Medicine

## 2021-02-19 ENCOUNTER — Encounter: Payer: Self-pay | Admitting: Family Medicine

## 2021-02-19 VITALS — BP 106/64 | HR 70 | Ht 66.0 in | Wt 134.0 lb

## 2021-02-19 DIAGNOSIS — M545 Low back pain, unspecified: Secondary | ICD-10-CM | POA: Diagnosis not present

## 2021-02-19 DIAGNOSIS — M9904 Segmental and somatic dysfunction of sacral region: Secondary | ICD-10-CM

## 2021-02-19 DIAGNOSIS — M9903 Segmental and somatic dysfunction of lumbar region: Secondary | ICD-10-CM

## 2021-02-19 DIAGNOSIS — M9901 Segmental and somatic dysfunction of cervical region: Secondary | ICD-10-CM

## 2021-02-19 DIAGNOSIS — M9908 Segmental and somatic dysfunction of rib cage: Secondary | ICD-10-CM | POA: Diagnosis not present

## 2021-02-19 DIAGNOSIS — M9902 Segmental and somatic dysfunction of thoracic region: Secondary | ICD-10-CM

## 2021-02-19 DIAGNOSIS — G8929 Other chronic pain: Secondary | ICD-10-CM

## 2021-02-19 NOTE — Assessment & Plan Note (Signed)
Mild increase in low back pain.  Seems to be more secondary to more of the piriformis on the left side today.  Patient is responding fairly well though to osteopathic manipulation.  Avoided any thoracic spine secondary to the exacerbation patient had previously we discussed icing regimen and home exercises.  Increase activity slowly.  Follow-up again in 4 to 8 weeks

## 2021-02-19 NOTE — Patient Instructions (Signed)
See me again in 6 weeks 

## 2021-04-01 NOTE — Progress Notes (Signed)
[name] [address] [place] [address] Phone: [phone] Subjective:   Jaclyn [name], am serving as a scribe for Dr. Antoine [name]. This visit occurred during the SARS-CoV-2 public health emergency.  Safety protocols were in place, including screening questions prior to the visit, additional usage of staff PPE, and extensive cleaning of exam room while observing appropriate contact time as indicated for disinfecting solutions.   I'm seeing this patient by the request  of:  Patient, No Pcp Per (Inactive)  CC: Back pain follow-up  [id number]  Jaclyn [name] is a 45 y.o. female coming in with complaint of back and neck pain. OMT 02/19/2021. Patient states that she has a few ribs out but otherwise is doing well.  Mild tightness still noted in the upper thoracic spine on the right side greater than left.  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  Does have tightness of the low back as well.  Medications patient has been prescribed: Vit D  Taking:         Reviewed prior external information including notes and imaging from previsou exam, outside providers and external EMR if available.   As well as notes that were available from care everywhere and other healthcare systems.  Past medical history, social, surgical and family history all reviewed in electronic medical record.  No pertanent information unless stated regarding to the chief complaint.   Past Medical History:  Diagnosis Date   Allergy    Anxiety    Asthma    Chicken pox    GERD (gastroesophageal reflux disease)    IBS (irritable bowel syndrome)    Pneumonia 2015    No Known Allergies   Review of Systems:  No headache, visual changes, nausea, vomiting, diarrhea, constipation, dizziness, abdominal pain, skin rash, fevers, chills, night sweats, weight loss, swollen lymph nodes, body aches, joint swelling, chest pain, shortness of breath, mood changes.  POSITIVE muscle aches  Objective  Blood pressure 100/60, pulse 79, height 5\' 6"  (1.676 m), weight 136 lb (61.7 kg).   General: No apparent distress alert and oriented x3 mood and affect normal, dressed appropriately.  HEENT: Pupils equal, extraocular movements intact  Respiratory: Patient's speak in full sentences and does not appear short of breath  Cardiovascular: No lower extremity edema, non tender, no erythema  Tightness still noted on the paraspinal and parascapular region right greater than left.  No pain in the midline tenderness at this point.  Patient does have tightness noted of the hip flexors bilaterally.  Does have hypermobility noted noted of the back.  Other areas.  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  Will discuss with patient again about continuing to work on the scapular stability.  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  Has muscle relaxers.  We will hold on anything else such as high velocity of this area for now.  Patient will continue to increase activity and follow-up again in 4 to 6 weeks  Low back pain Tightness noted again.  Encouraged her to continue to increase activity.  Discussed icing regimen and  home exercises.  Follow-up again in 6 to 8 weeks   Nonallopathic problems  Decision today to treat with OMT was based on Physical Exam  After verbal consent patient was treated with HVLA, ME, FPR techniques in cervical, rib, thoracic, lumbar, and sacral  areas we avoided HVLA in the thoracic spine secondary to patient's previous exacerbations.  Patient tolerated the procedure well with improvement in  symptoms  Patient given exercises, stretches and lifestyle modifications  See medications in patient instructions if given  Patient will follow up in 4-8 weeks      The above documentation has been reviewed and is accurate and complete [name], DO        Note: This dictation was prepared with Dragon dictation along with smaller phrase technology. Any transcriptional errors that result from this process are unintentional.

## 2021-04-02 ENCOUNTER — Ambulatory Visit (INDEPENDENT_AMBULATORY_CARE_PROVIDER_SITE_OTHER): Payer: 59 | Admitting: Family Medicine

## 2021-04-02 ENCOUNTER — Encounter: Payer: Self-pay | Admitting: Family Medicine

## 2021-04-02 ENCOUNTER — Other Ambulatory Visit: Payer: Self-pay

## 2021-04-02 VITALS — BP 100/60 | HR 79 | Ht 66.0 in | Wt 136.0 lb

## 2021-04-02 DIAGNOSIS — M545 Low back pain, unspecified: Secondary | ICD-10-CM

## 2021-04-02 DIAGNOSIS — M9904 Segmental and somatic dysfunction of sacral region: Secondary | ICD-10-CM | POA: Diagnosis not present

## 2021-04-02 DIAGNOSIS — M9908 Segmental and somatic dysfunction of rib cage: Secondary | ICD-10-CM | POA: Diagnosis not present

## 2021-04-02 DIAGNOSIS — M9903 Segmental and somatic dysfunction of lumbar region: Secondary | ICD-10-CM | POA: Diagnosis not present

## 2021-04-02 DIAGNOSIS — M9901 Segmental and somatic dysfunction of cervical region: Secondary | ICD-10-CM | POA: Diagnosis not present

## 2021-04-02 DIAGNOSIS — M9902 Segmental and somatic dysfunction of thoracic region: Secondary | ICD-10-CM

## 2021-04-02 DIAGNOSIS — M940 Chondrocostal junction syndrome [Tietze]: Secondary | ICD-10-CM | POA: Diagnosis not present

## 2021-04-02 DIAGNOSIS — G8929 Other chronic pain: Secondary | ICD-10-CM

## 2021-04-02 NOTE — Assessment & Plan Note (Signed)
Chronic issue on the right side.  Will discuss with patient again about continuing to work on the scapular stability.  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  Has muscle relaxers.  We will hold on anything else such as high velocity of this area for now.  Patient will continue to increase activity and follow-up again in 4 to 6 weeks

## 2021-04-02 NOTE — Assessment & Plan Note (Signed)
Tightness noted again.  Encouraged her to continue to increase activity.  Discussed icing regimen and home exercises.  Follow-up again in 6 to 8 weeks

## 2021-04-02 NOTE — Patient Instructions (Signed)
See me again in 5 weeks 

## 2021-04-23 ENCOUNTER — Other Ambulatory Visit: Payer: Self-pay

## 2021-04-23 ENCOUNTER — Ambulatory Visit (INDEPENDENT_AMBULATORY_CARE_PROVIDER_SITE_OTHER): Payer: 59 | Admitting: Family Medicine

## 2021-04-23 ENCOUNTER — Encounter: Payer: Self-pay | Admitting: Family Medicine

## 2021-04-23 VITALS — BP 100/70 | HR 78 | Ht 66.0 in | Wt 136.0 lb

## 2021-04-23 DIAGNOSIS — M9901 Segmental and somatic dysfunction of cervical region: Secondary | ICD-10-CM | POA: Diagnosis not present

## 2021-04-23 DIAGNOSIS — M940 Chondrocostal junction syndrome [Tietze]: Secondary | ICD-10-CM

## 2021-04-23 DIAGNOSIS — M9908 Segmental and somatic dysfunction of rib cage: Secondary | ICD-10-CM | POA: Diagnosis not present

## 2021-04-23 DIAGNOSIS — M9902 Segmental and somatic dysfunction of thoracic region: Secondary | ICD-10-CM | POA: Diagnosis not present

## 2021-04-23 DIAGNOSIS — M9904 Segmental and somatic dysfunction of sacral region: Secondary | ICD-10-CM

## 2021-04-23 DIAGNOSIS — M9903 Segmental and somatic dysfunction of lumbar region: Secondary | ICD-10-CM | POA: Diagnosis not present

## 2021-04-23 NOTE — Progress Notes (Signed)
[name] [address] [place] [address] Phone: [phone] Subjective:   Jaclyn [name], am serving as a scribe for Dr. Antoine Primas.  I'm seeing this patient by the request  of:  Patient, No Pcp Per (Inactive)  CC:   [id number]  Jaclyn [name] is a 45 y.o. female coming in with complaint of Rib pain. Patient states that her Rib has been popped out of place since the Sunday after the last visit. Patient states she has just been living with the pain.       Past Medical History:  Diagnosis Date   Allergy    Anxiety    Asthma    Chicken pox    GERD (gastroesophageal reflux disease)    IBS (irritable bowel syndrome)    Pneumonia 2015   Past Surgical History:  Procedure Laterality Date   FOOT SURGERY     Social History   Socioeconomic History   Marital status: Legally Separated    Spouse name: Not on file   Number of children: Not on file   Years of education: Not on file   Highest education level: Not on file  Occupational History   Not on file  Tobacco Use   Smoking status: Never   Smokeless tobacco: Never  Vaping Use   Vaping Use: Never used  Substance and Sexual Activity   Alcohol use: No    Alcohol/week: 0.0 standard drinks   Drug use: No   Sexual activity: Not on file  Other Topics Concern   Not on file  Social History Narrative   Not on file   Social Determinants of Health   Financial Resource Strain: Not on file  Food Insecurity: Not on file  Transportation Needs: Not on file  Physical Activity: Not on file  Stress: Not on file  Social Connections: Not on file   No Known Allergies Family History  Problem Relation Age of Onset   Alcohol abuse Mother    Cancer Mother    Heart disease Mother    Multiple sclerosis Mother    Alcohol abuse Father    Heart disease Father    Bladder Cancer Father    Multiple sclerosis Brother    Lung cancer Paternal Grandmother    Esophageal cancer Paternal  Grandmother    Colon cancer Neg Hx     Current Outpatient Medications (Endocrine & Metabolic):    predniSONE (DELTASONE) 20 MG tablet, 2 pills daily for 3 days, 1 pill daily for 3 days and then 1/2 pill daily for 4 days   Current Outpatient Medications (Respiratory):    fluticasone (FLONASE) 50 MCG/ACT nasal spray, Place 1 spray into both nostrils daily.  Current Outpatient Medications (Analgesics):    naproxen (NAPROSYN) 500 MG tablet, Take 1 tablet (500 mg total) by mouth 2 (two) times daily with a meal. (Patient taking differently: Take 500 mg by mouth as needed.)  Current Outpatient Medications (Hematological):    vitamin B-12 (CYANOCOBALAMIN) 250 MCG tablet, Take 250 mcg by mouth daily.  Current Outpatient Medications (Other):    diazepam (VALIUM) 5 MG tablet, One tab by mouth, 2 hours before procedure.   esomeprazole (NEXIUM) 40 MG capsule, Take 40 mg by mouth 2 (two) times daily.   MAGNESIUM CITRATE PO*, Take by mouth daily. 3/4 tsp in 1 liter of water   tizanidine (ZANAFLEX) 2 MG capsule, Take 1 capsule (2 mg total) by mouth 3 (three) times daily as needed for muscle spasms.  Vitamin D, Ergocalciferol, (DRISDOL) 1.25 MG (50000 UNIT) CAPS capsule, Take 1 capsule (50,000 Units total) by mouth every 7 (seven) days.   Vitamin D, Ergocalciferol, (DRISDOL) 1.25 MG (50000 UNIT) CAPS capsule, Take 1 capsule (50,000 Units total) by mouth every 7 (seven) days. * These medications belong to multiple therapeutic classes and are listed under each applicable group.   Reviewed prior external information including notes and imaging from  primary care provider As well as notes that were available from care everywhere and other healthcare systems.  Past medical history, social, surgical and family history all reviewed in electronic medical record.  No pertanent information unless stated regarding to the chief complaint.   Review of Systems:  No headache, visual changes, nausea, vomiting,  diarrhea, constipation, dizziness, abdominal pain, skin rash, fevers, chills, night sweats, weight loss, swollen lymph nodes, body aches, joint swelling, chest pain, shortness of breath, mood changes. POSITIVE muscle aches  Objective  Blood pressure 100/70, pulse 78, height 5\' 6"  (1.676 m), SpO2 99 %.   General: No apparent distress alert and oriented x3 mood and affect normal, dressed appropriately.  HEENT: Pupils equal, extraocular movements intact  Respiratory: Patient's speak in full sentences and does not appear short of breath  Cardiovascular: No lower extremity edema, non tender, no erythema  Gait normal with good balance and coordination.  MSK:  Non tender with full range of motion and good stability and symmetric strength and tone of shoulders, elbows, wrist, hip, knee and ankles bilaterally.     Impression and Recommendations:     The above documentation has been reviewed and is accurate and complete , CMA

## 2021-04-23 NOTE — Patient Instructions (Addendum)
Good to see you  2 ibuprofen 2 times a day Take muscle relaxer for the next three nights  See me again as scheduled

## 2021-04-23 NOTE — Assessment & Plan Note (Signed)
To theChronic problem with exacerbation.  Discussed icing regimen and home exercises.  Has Zanaflex for breakthrough.  Encourage patient to continue to work on the stability exercises.  Possibility of formal physical therapy.  Advanced imaging did not show anything and only thing left would be a CT angiogram but I think at this moment continue with conservative therapy follow-up as scheduled

## 2021-04-23 NOTE — Progress Notes (Signed)
[name] [address] [place] [address] Phone: [phone] Subjective:    I'm seeing this patient by the request  of:  Patient, No Pcp Per (Inactive)  CC: Rib pain and upper back pain follow-up  [id number]  Jaclyn [name] is a 45 y.o. female coming in with complaint of rib pain again.  Patient has been seen previously for this.  Has responded well to manipulation previously.  Describes the pain as a dull, throbbing aching pain.  States that she was moving laundry and seem to have unfortunately increased discomfort.  No recent injury other than the movement she states when she was doing the laundry.       Past Medical History:  Diagnosis Date   Allergy    Anxiety    Asthma    Chicken pox    GERD (gastroesophageal reflux disease)    IBS (irritable bowel syndrome)    Pneumonia 2015   Past Surgical History:  Procedure Laterality Date   FOOT SURGERY     Social History   Socioeconomic History   Marital status: Legally Separated    Spouse name: Not on file   Number of children: Not on file   Years of education: Not on file   Highest education level: Not on file  Occupational History   Not on file  Tobacco Use   Smoking status: Never   Smokeless tobacco: Never  Vaping Use   Vaping Use: Never used  Substance and Sexual Activity   Alcohol use: No    Alcohol/week: 0.0 standard drinks   Drug use: No   Sexual activity: Not on file  Other Topics Concern   Not on file  Social History Narrative   Not on file   Social Determinants of Health   Financial Resource Strain: Not on file  Food Insecurity: Not on file  Transportation Needs: Not on file  Physical Activity: Not on file  Stress: Not on file  Social Connections: Not on file   No Known Allergies Family History  Problem Relation Age of Onset   Alcohol abuse Mother    Cancer Mother    Heart disease Mother    Multiple sclerosis Mother    Alcohol abuse Father     Heart disease Father    Bladder Cancer Father    Multiple sclerosis Brother    Lung cancer Paternal Grandmother    Esophageal cancer Paternal Grandmother    Colon cancer Neg Hx     Current Outpatient Medications (Endocrine & Metabolic):    predniSONE (DELTASONE) 20 MG tablet, 2 pills daily for 3 days, 1 pill daily for 3 days and then 1/2 pill daily for 4 days   Current Outpatient Medications (Respiratory):    fluticasone (FLONASE) 50 MCG/ACT nasal spray, Place 1 spray into both nostrils daily.  Current Outpatient Medications (Analgesics):    naproxen (NAPROSYN) 500 MG tablet, Take 1 tablet (500 mg total) by mouth 2 (two) times daily with a meal. (Patient taking differently: Take 500 mg by mouth as needed.)  Current Outpatient Medications (Hematological):    vitamin B-12 (CYANOCOBALAMIN) 250 MCG tablet, Take 250 mcg by mouth daily.  Current Outpatient Medications (Other):    diazepam (VALIUM) 5 MG tablet, One tab by mouth, 2 hours before procedure.   esomeprazole (NEXIUM) 40 MG capsule, Take 40 mg by mouth 2 (two) times daily.   MAGNESIUM CITRATE PO*, Take by mouth daily. 3/4 tsp in 1 liter of water   tizanidine (ZANAFLEX)  2 MG capsule, Take 1 capsule (2 mg total) by mouth 3 (three) times daily as needed for muscle spasms.   Vitamin D, Ergocalciferol, (DRISDOL) 1.25 MG (50000 UNIT) CAPS capsule, Take 1 capsule (50,000 Units total) by mouth every 7 (seven) days.   Vitamin D, Ergocalciferol, (DRISDOL) 1.25 MG (50000 UNIT) CAPS capsule, Take 1 capsule (50,000 Units total) by mouth every 7 (seven) days. * These medications belong to multiple therapeutic classes and are listed under each applicable group.   Reviewed prior external information including notes and imaging from  primary care provider As well as notes that were available from care everywhere and other healthcare systems.  Past medical history, social, surgical and family history all reviewed in electronic medical record.  No  pertanent information unless stated regarding to the chief complaint.   Review of Systems:  No headache, visual changes, nausea, vomiting, diarrhea, constipation, dizziness, abdominal pain, skin rash, fevers, chills, night sweats, weight loss, swollen lymph nodes, body aches, joint swelling, chest pain, shortness of breath, mood changes. POSITIVE muscle aches  Objective  Blood pressure 100/70, pulse 78, height 5\' 6"  (1.676 m), weight 136 lb (61.7 kg), SpO2 99 %.   General: No apparent distress alert and oriented x3 mood and affect normal, dressed appropriately.  HEENT: Pupils equal, extraocular movements intact  Respiratory: Patient's speak in full sentences and does not appear short of breath  Cardiovascular: No lower extremity edema, non tender, no erythema  Gait normal with good balance and coordination.  MSK: Increased tenderness to palpation in the right parascapular region.  No cortical defect noted.  No bruising or erythema noted.  Full range of motion of the neck noted.  Osteopathic findings C3 flexed rotated and side bent right T7 extended and rotated and side bent right with inhaled rib L5 flexed rotated and side bent left Sacrum right on right   Impression and Recommendations:     The above documentation has been reviewed and is accurate and complete , DO

## 2021-05-13 NOTE — Progress Notes (Signed)
  [name] [address] [place] [address] Phone: [phone] Subjective:    I'm seeing this patient by the request  of:  Patient, No Pcp Per (Inactive)  I, Philbert Riser, LAT, ATC acting as a scribe for [employer], DO.  CC: Back pain follow-up  OIN:[id number]  Jaclyn [name] is a 45 y.o. female coming in with complaint of back and neck pain. OMT 04/23/2021. Patient states she is here just for a regular maintenance adjustment. No worsening issues or concerns.  Patient states that overall feeling relatively good.  Has been able to ride her bike on a much more regular basis at this time.  No radiation of pain, no numbness.  Still has tightness noted in the paraspinal musculature.  Medications patient has been prescribed: Vit D  Taking: Yes        Past Medical History:  Diagnosis Date   Allergy    Anxiety    Asthma    Chicken pox    GERD (gastroesophageal reflux disease)    IBS (irritable bowel syndrome)    Pneumonia 2015    No Known Allergies   Review of Systems:  No headache, visual changes, nausea, vomiting, diarrhea, constipation, dizziness, abdominal pain, skin rash, fevers, chills, night sweats, weight loss, swollen lymph nodes, body aches, joint swelling, chest pain, shortness of breath, mood changes. POSITIVE muscle aches  Objective  Height 5\' 6"  (1.676 m), weight 135 lb (61.2 kg).   General: No apparent distress alert and oriented x3 mood and affect normal, dressed appropriately.  HEENT: Pupils equal, extraocular movements intact  Respiratory: Patient's speak in full sentences and does not appear short of breath  Cardiovascular: No lower extremity edema, non tender, no erythema  Patient is some tenderness to palpation in the paraspinal musculature more on the right side of the parascapular region.  Still some mild tightness in the lumbar spine but nothing significant.  Patient does still have a hypermobility noted to  multiple other joints.  Osteopathic findings  C5 flexed rotated and side bent right T3 extended rotated and side bent right inhaled rib L5 flexed rotated and side bent left Sacrum right on right       Assessment and Plan:  Slipped rib syndrome Patient relatively well.  Still working more on the muscle energy with very mild HVLA.  We will continue to monitor closely.  We will monitor stress significantly.  Follow-up with me again 6 to 8 weeks.   Nonallopathic problems  Decision today to treat with OMT was based on Physical Exam  After verbal consent patient was treated with HVLA, ME, FPR techniques in cervical, rib, thoracic, lumbar, and sacral  areas  Patient tolerated the procedure well with improvement in symptoms  Patient given exercises, stretches and lifestyle modifications  See medications in patient instructions if given  Patient will follow up in 4-8 weeks      The above documentation has been reviewed and is accurate and complete , DO        Note: This dictation was prepared with Dragon dictation along with smaller phrase technology. Any transcriptional errors that result from this process are unintentional.

## 2021-05-14 ENCOUNTER — Other Ambulatory Visit: Payer: Self-pay

## 2021-05-14 ENCOUNTER — Ambulatory Visit (INDEPENDENT_AMBULATORY_CARE_PROVIDER_SITE_OTHER): Payer: 59 | Admitting: Family Medicine

## 2021-05-14 ENCOUNTER — Encounter: Payer: Self-pay | Admitting: Family Medicine

## 2021-05-14 VITALS — Ht 66.0 in | Wt 135.0 lb

## 2021-05-14 DIAGNOSIS — M9901 Segmental and somatic dysfunction of cervical region: Secondary | ICD-10-CM | POA: Diagnosis not present

## 2021-05-14 DIAGNOSIS — M9908 Segmental and somatic dysfunction of rib cage: Secondary | ICD-10-CM

## 2021-05-14 DIAGNOSIS — M9903 Segmental and somatic dysfunction of lumbar region: Secondary | ICD-10-CM

## 2021-05-14 DIAGNOSIS — M9904 Segmental and somatic dysfunction of sacral region: Secondary | ICD-10-CM | POA: Diagnosis not present

## 2021-05-14 DIAGNOSIS — M940 Chondrocostal junction syndrome [Tietze]: Secondary | ICD-10-CM | POA: Diagnosis not present

## 2021-05-14 DIAGNOSIS — M9902 Segmental and somatic dysfunction of thoracic region: Secondary | ICD-10-CM

## 2021-05-14 NOTE — Patient Instructions (Addendum)
Good to see you  Let's keep the rib in place  See me again in 6 weeks

## 2021-05-14 NOTE — Assessment & Plan Note (Signed)
Patient relatively well.  Still working more on the muscle energy with very mild HVLA.  We will continue to monitor closely.  We will monitor stress significantly.  Follow-up with me again 6 to 8 weeks.

## 2021-06-24 NOTE — Progress Notes (Signed)
  [name] [address] [place] [address] Phone: [phone] Subjective:   [name], am serving as a scribe for Dr. Antoine Primas. This visit occurred during the SARS-CoV-2 public health emergency.  Safety protocols were in place, including screening questions prior to the visit, additional usage of staff PPE, and extensive cleaning of exam room while observing appropriate contact time as indicated for disinfecting solutions.     CC: Neck and back pain follow-up  [id number]  Jaclyn [name] is a 45 y.o. female coming in with complaint of back and neck pain. OMT on 05/14/2021. Patient states that she has been doing well since last visit.  Patient does have some tenderness to palpation.  Patient states some very mild upper back pain but nothing as severe as what she was having multiple months ago.  Overall has been able to be much more active.  Medications patient has been prescribed: None  Taking:         Past Medical History:  Diagnosis Date   Allergy    Anxiety    Asthma    Chicken pox    GERD (gastroesophageal reflux disease)    IBS (irritable bowel syndrome)    Pneumonia 2015    No Known Allergies   Review of Systems:  No headache, visual changes, nausea, vomiting, diarrhea, constipation, dizziness, abdominal pain, skin rash, fevers, chills, night sweats, weight loss, swollen lymph nodes, body aches, joint swelling, chest pain, shortness of breath, mood changes. POSITIVE muscle aches  Objective  Blood pressure 104/82, height 5\' 6"  (1.676 m).   General: No apparent distress alert and oriented x3 mood and affect normal, dressed appropriately.  HEENT: Pupils equal, extraocular movements intact  Respiratory: Patient's speak in full sentences and does not appear short of breath  Cardiovascular: No lower extremity edema, non tender, no erythema  Upper back exam is  Patient still has tenderness in the right scapular  region.  No masses appreciated.  Patient does have some mild trigger points.  Tightness of the right side of the neck also noted.  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  Patient responded well to the manipulation again.  Discussed icing regimen and home exercises.  Discussed which activities to do which wants to avoid.  Patient has been doing very well for some time but is starting to increase her activity.  Follow-up with me again in 6 weeks    Nonallopathic problems  Decision today to treat with OMT was based on Physical Exam  After verbal consent patient was treated with HVLA, ME, FPR techniques in cervical, rib, thoracic, lumbar, and sacral  areas  Patient tolerated the procedure well with improvement in symptoms  Patient given exercises, stretches and lifestyle modifications  See medications in patient instructions if given  Patient will follow up in 4-8 weeks      The above documentation has been reviewed and is accurate and complete , DO       Note: This dictation was prepared with Dragon dictation along with smaller phrase technology. Any transcriptional errors that result from this process are unintentional.

## 2021-06-25 ENCOUNTER — Ambulatory Visit (INDEPENDENT_AMBULATORY_CARE_PROVIDER_SITE_OTHER): Payer: 59 | Admitting: Family Medicine

## 2021-06-25 ENCOUNTER — Other Ambulatory Visit: Payer: Self-pay

## 2021-06-25 ENCOUNTER — Encounter: Payer: Self-pay | Admitting: Family Medicine

## 2021-06-25 VITALS — BP 104/82 | Ht 66.0 in

## 2021-06-25 DIAGNOSIS — M940 Chondrocostal junction syndrome [Tietze]: Secondary | ICD-10-CM

## 2021-06-25 DIAGNOSIS — M9908 Segmental and somatic dysfunction of rib cage: Secondary | ICD-10-CM

## 2021-06-25 DIAGNOSIS — M9904 Segmental and somatic dysfunction of sacral region: Secondary | ICD-10-CM

## 2021-06-25 DIAGNOSIS — M9905 Segmental and somatic dysfunction of pelvic region: Secondary | ICD-10-CM | POA: Diagnosis not present

## 2021-06-25 DIAGNOSIS — M9901 Segmental and somatic dysfunction of cervical region: Secondary | ICD-10-CM

## 2021-06-25 DIAGNOSIS — M9903 Segmental and somatic dysfunction of lumbar region: Secondary | ICD-10-CM

## 2021-06-25 DIAGNOSIS — M9902 Segmental and somatic dysfunction of thoracic region: Secondary | ICD-10-CM

## 2021-06-25 NOTE — Assessment & Plan Note (Signed)
Continued difficulty with the slippage syndrome.  Patient responded well to the manipulation again.  Discussed icing regimen and home exercises.  Discussed which activities to do which wants to avoid.  Patient has been doing very well for some time but is starting to increase her activity.  Follow-up with me again in 6 weeks

## 2021-06-25 NOTE — Patient Instructions (Signed)
See me in 6 weeks  Good luck with Greggory Stallion!
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
## 2021-08-06 ENCOUNTER — Ambulatory Visit (INDEPENDENT_AMBULATORY_CARE_PROVIDER_SITE_OTHER): Payer: 59 | Admitting: Family Medicine

## 2021-08-06 ENCOUNTER — Encounter: Payer: Self-pay | Admitting: Family Medicine

## 2021-08-06 ENCOUNTER — Other Ambulatory Visit: Payer: Self-pay

## 2021-08-06 VITALS — BP 110/70 | HR 68 | Ht 66.0 in | Wt 139.0 lb

## 2021-08-06 DIAGNOSIS — M940 Chondrocostal junction syndrome [Tietze]: Secondary | ICD-10-CM | POA: Diagnosis not present

## 2021-08-06 DIAGNOSIS — M9903 Segmental and somatic dysfunction of lumbar region: Secondary | ICD-10-CM

## 2021-08-06 DIAGNOSIS — M9904 Segmental and somatic dysfunction of sacral region: Secondary | ICD-10-CM | POA: Diagnosis not present

## 2021-08-06 DIAGNOSIS — M9901 Segmental and somatic dysfunction of cervical region: Secondary | ICD-10-CM | POA: Diagnosis not present

## 2021-08-06 DIAGNOSIS — M9902 Segmental and somatic dysfunction of thoracic region: Secondary | ICD-10-CM

## 2021-08-06 DIAGNOSIS — M9908 Segmental and somatic dysfunction of rib cage: Secondary | ICD-10-CM
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
## 2021-08-07 ENCOUNTER — Telehealth: Payer: Self-pay | Admitting: Family Medicine

## 2021-08-07 NOTE — Telephone Encounter (Signed)
error 

## 2021-09-04 ENCOUNTER — Telehealth: Payer: Self-pay

## 2021-09-04 NOTE — Progress Notes (Signed)
[name] [name] [place] [address] Phone: [phone]   Assessment and Plan:     1. Somatic dysfunction of cervical region 2. Somatic dysfunction of thoracic region 3. Somatic dysfunction of lumbar region 4. Somatic dysfunction of pelvic region 5. Somatic dysfunction of rib region 6.  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  Elects for repeat OMT today.  Tolerated well per note below. - Decision today to treat with OMT was based on Physical Exam   After verbal consent patient was treated with HVLA (high velocity low amplitude), ME (muscle energy), FPR (flex positional release), ST (soft tissue), PC/PD (Pelvic Compression/ Pelvic Decompression) techniques in cervical, rib, thoracic, lumbar, and pelvic areas. Patient tolerated the procedure well with improvement in symptoms.  Patient educated on potential side effects of soreness and recommended to rest, hydrate, and use Tylenol as needed for pain control.   Pertinent previous records reviewed include none   Follow Up: 4 to 6 weeks for repeat OMT   Subjective:   I, Jaclyn [name], am serving as a scribe for Dr. Richardean Sale  Chief Complaint: Neck and back pain   HPI:   09/07/21 Jaclyn [name] is a 45 year old female presenting with neck and back pain. Patient was last seen by Dr. [name] on 08/06/21 for this reason and had OMT. Today patient states that she has a rib out on the right side since Friday.  Relevant Historical Information: IBS, slipped rib syndrome  Additional pertinent review of systems negative.  Current Outpatient Medications  Medication Sig Dispense Refill   diazepam (VALIUM) 5 MG tablet One tab by mouth, 2 hours before procedure. 2 tablet 0   esomeprazole (NEXIUM) 40 MG capsule Take 40 mg by mouth 2 (two) times daily.     fluticasone (FLONASE) 50 MCG/ACT nasal spray Place  1 spray into both nostrils daily.     MAGNESIUM CITRATE PO Take by mouth daily. 3/4 tsp in 1 liter of water     naproxen (NAPROSYN) 500 MG tablet Take 1 tablet (500 mg total) by mouth 2 (two) times daily with a meal. (Patient taking differently: Take 500 mg by mouth as needed.) 30 tablet 0   predniSONE (DELTASONE) 20 MG tablet 2 pills daily for 3 days, 1 pill daily for 3 days and then 1/2 pill daily for 4 days 11 tablet 0   tizanidine (ZANAFLEX) 2 MG capsule Take 1 capsule (2 mg total) by mouth 3 (three) times daily as needed for muscle spasms. 30 capsule 1   vitamin B-12 (CYANOCOBALAMIN) 250 MCG tablet Take 250 mcg by mouth daily.     Vitamin D, Ergocalciferol, (DRISDOL) 1.25 MG (50000 UNIT) CAPS capsule Take 1 capsule (50,000 Units total) by mouth every 7 (seven) days. 12 capsule 0   Vitamin D, Ergocalciferol, (DRISDOL) 1.25 MG (50000 UNIT) CAPS capsule Take 1 capsule (50,000 Units total) by mouth every 7 (seven) days. 4 capsule 0   No current facility-administered medications for this visit.      Objective:     Vitals:   09/07/21 0948  BP: 112/62  Pulse: 64  SpO2: 99%  Weight: 138 lb (62.6 kg)  Height: 5\' 6"  (1.676 m)      Body mass index is 22.27 kg/m.    Physical Exam:     General: Well-appearing, cooperative, sitting comfortably in no acute distress.   OMT Physical Exam:  ASIS Compression Test: Positive Right Cervical: TTP paraspinal, C3 RRSL, C5-7 RR SR Rib: Bilateral elevated first rib with TTP, worse on left Thoracic: TTP paraspinal, T 2 RRSR, T6-10 RRSL Lumbar: TTP paraspinal, L2 RRSR Pelvis: Right anterior innominate  Electronically signed by:  [name] 10:54 AM 09/07/21

## 2021-09-04 NOTE — Telephone Encounter (Signed)
Spoke with patient.

## 2021-09-07 ENCOUNTER — Other Ambulatory Visit: Payer: Self-pay

## 2021-09-07 ENCOUNTER — Ambulatory Visit (INDEPENDENT_AMBULATORY_CARE_PROVIDER_SITE_OTHER): Payer: 59 | Admitting: Sports Medicine

## 2021-09-07 VITALS — BP 112/62 | HR 64 | Ht 66.0 in | Wt 138.0 lb

## 2021-09-07 DIAGNOSIS — M9903 Segmental and somatic dysfunction of lumbar region: Secondary | ICD-10-CM

## 2021-09-07 DIAGNOSIS — M9908 Segmental and somatic dysfunction of rib cage: Secondary | ICD-10-CM

## 2021-09-07 DIAGNOSIS — M9902 Segmental and somatic dysfunction of thoracic region: Secondary | ICD-10-CM | POA: Diagnosis not present

## 2021-09-07 DIAGNOSIS — M9901 Segmental and somatic dysfunction of cervical region: Secondary | ICD-10-CM

## 2021-09-07 DIAGNOSIS — M9905 Segmental and somatic dysfunction of pelvic region: Secondary | ICD-10-CM | POA: Diagnosis not present

## 2021-09-07 DIAGNOSIS — M940 Chondrocostal junction syndrome [Tietze]: Secondary | ICD-10-CM

## 2021-09-07 NOTE — Patient Instructions (Signed)
Good to see you   Follow up in  

## 2021-09-10 ENCOUNTER — Ambulatory Visit: Payer: 59 | Admitting: Family Medicine

## 2021-10-13 NOTE — Progress Notes (Signed)
[name] [address] [place] [address] Phone: [phone] Subjective:   Jaclyn [name], am serving as a scribe for Dr. Antoine Primas.  This visit occurred during the SARS-CoV-2 public health emergency.  Safety protocols were in place, including screening questions prior to the visit, additional usage of staff PPE, and extensive cleaning of exam room while observing appropriate contact time as indicated for disinfecting solutions.   I'm seeing this patient by the request  of:  Patient, No Pcp Per (Inactive)  CC:   [id number]  Jaclyn [name] is a 45 y.o. female coming in with complaint of back and neck pain. OMT with Dr. Jean Rosenthal on 09/07/2021. Patient states that her L ankle is bothering her over lateral mal. Has been on her feet a lot lately.   Medications patient has been prescribed: None  Taking:         Reviewed prior external information including notes and imaging from previsou exam, outside providers and external EMR if available.   As well as notes that were available from care everywhere and other healthcare systems.  Past medical history, social, surgical and family history all reviewed in electronic medical record.  No pertanent information unless stated regarding to the chief complaint.   Past Medical History:  Diagnosis Date   Allergy    Anxiety    Asthma    Chicken pox    GERD (gastroesophageal reflux disease)    IBS (irritable bowel syndrome)    Pneumonia 2015    No Known Allergies   Review of Systems:  No headache, visual changes, nausea, vomiting, diarrhea, constipation, dizziness, abdominal pain, skin rash, fevers, chills, night sweats, weight loss, swollen lymph nodes, body aches, joint swelling, chest pain, shortness of breath, mood changes. POSITIVE muscle aches  Objective  Blood pressure 104/60, pulse 74, height 5\' 6"  (1.676 m), weight 143 lb (64.9 kg), SpO2 99 %.   General: No apparent distress  alert and oriented x3 mood and affect normal, dressed appropriately.  HEENT: Pupils equal, extraocular movements intact   Cardiovascular: No lower extremity edema, non tender, no erythema  Neck exam does have some mild loss of lordosis.  Patient does have tightness more on the right side of the neck patient tightness in the parascapular region on the right side as well.  Support noted.  Osteopathic findings  C2 flexed rotated and side bent right C3 flexed rotated and side bent T3 extended rotated and side bent right inhaled rib T9 extended rotated and side bent left L2 flexed rotated and side bent right Sacrum right on right       Assessment and Plan:  Slipped rib syndrome Continued difficulty noted.  Patient does does respond extremely well to osteopathic manipulation.  No change in medications but does have the Zanaflex as needed.  Did have some mild increase in neck pain recently.  Follow-up again in 6 weeks   Nonallopathic problems  Decision today to treat with OMT was based on Physical Exam  After verbal consent patient was treated with HVLA, ME, FPR techniques in cervical, rib, thoracic, lumbar, and sacral  areas  Patient tolerated the procedure well with improvement in symptoms  Patient given exercises, stretches and lifestyle modifications  See medications in patient instructions if given  Patient will follow up in 4-8 weeks      The above documentation has been reviewed and is accurate and complete , DO       Note: This dictation  was prepared with Dragon dictation along with smaller phrase technology. Any transcriptional errors that result from this process are unintentional.

## 2021-10-14 ENCOUNTER — Other Ambulatory Visit: Payer: Self-pay

## 2021-10-14 ENCOUNTER — Ambulatory Visit (INDEPENDENT_AMBULATORY_CARE_PROVIDER_SITE_OTHER): Payer: 59 | Admitting: Family Medicine

## 2021-10-14 VITALS — BP 104/60 | HR 74 | Ht 66.0 in | Wt 143.0 lb

## 2021-10-14 DIAGNOSIS — M9903 Segmental and somatic dysfunction of lumbar region: Secondary | ICD-10-CM | POA: Diagnosis not present

## 2021-10-14 DIAGNOSIS — M940 Chondrocostal junction syndrome [Tietze]: Secondary | ICD-10-CM | POA: Diagnosis not present

## 2021-10-14 DIAGNOSIS — M9908 Segmental and somatic dysfunction of rib cage: Secondary | ICD-10-CM | POA: Diagnosis not present

## 2021-10-14 DIAGNOSIS — M9901 Segmental and somatic dysfunction of cervical region: Secondary | ICD-10-CM

## 2021-10-14 DIAGNOSIS — M9904 Segmental and somatic dysfunction of sacral region: Secondary | ICD-10-CM

## 2021-10-14 DIAGNOSIS — M9902 Segmental and somatic dysfunction of thoracic region: Secondary | ICD-10-CM

## 2021-10-14 NOTE — Assessment & Plan Note (Signed)
Continued difficulty noted.  Patient does does respond extremely well to osteopathic manipulation.  No change in medications but does have the Zanaflex as needed.  Did have some mild increase in neck pain recently.  Follow-up again in 6 weeks

## 2021-10-14 NOTE — Patient Instructions (Signed)
See me again in 6 weeks 

## 2021-11-20 ENCOUNTER — Other Ambulatory Visit: Payer: Self-pay

## 2021-11-20 ENCOUNTER — Ambulatory Visit (INDEPENDENT_AMBULATORY_CARE_PROVIDER_SITE_OTHER): Payer: 59 | Admitting: Family Medicine

## 2021-11-20 VITALS — BP 110/70 | HR 66 | Ht 66.0 in | Wt 143.0 lb

## 2021-11-20 DIAGNOSIS — H8112 Benign paroxysmal vertigo, left ear: Secondary | ICD-10-CM

## 2021-11-20 DIAGNOSIS — M9904 Segmental and somatic dysfunction of sacral region: Secondary | ICD-10-CM

## 2021-11-20 DIAGNOSIS — M9901 Segmental and somatic dysfunction of cervical region: Secondary | ICD-10-CM | POA: Diagnosis not present

## 2021-11-20 DIAGNOSIS — M9908 Segmental and somatic dysfunction of rib cage: Secondary | ICD-10-CM

## 2021-11-20 DIAGNOSIS — M9902 Segmental and somatic dysfunction of thoracic region: Secondary | ICD-10-CM

## 2021-11-20 DIAGNOSIS — M9903 Segmental and somatic dysfunction of lumbar region: Secondary | ICD-10-CM

## 2021-11-20 MED ORDER — PREDNISONE 20 MG PO TABS: 20.0000 mg | ORAL_TABLET | Freq: Every day | ORAL | 0 refills | Status: DC

## 2021-11-20 NOTE — Progress Notes (Signed)
[name] [address] [place] [address] Phone: [phone] Subjective:   Jaclyn [name], am serving as a scribe for Dr. Antoine [name].This visit occurred during the SARS-CoV-2 public health emergency.  Safety protocols were in place, including screening questions prior to the visit, additional usage of staff PPE, and extensive cleaning of exam room while observing appropriate contact time as indicated for disinfecting solutions.   I'm seeing this patient by the request  of:  Patient, No Pcp Per (Inactive)  CC: Back and neck pain with dizziness  DTO:[id number]  Jaclyn [name] is a 46 y.o. female coming in with complaint of back and neck pain. OMT 10/14/2021. Patient states that Monday she woke up and her neck hurt. Experienced dizziness on Tuesday while sitting on the couch and then on Wednesday when she went to grab a case of water. Was able to go to work on Wednesday and was able to clean house and when she woke up that night the room was spinning. Saw someone to do maneuver to put ear crystals back in place. She does still get some "motion sensitivities" but not dizziness since yesterday.  Patient did take Valium and Dramamine that did help but made her significantly drowsy  Medications patient has been prescribed: None          Reviewed prior external information including notes and imaging from previsou exam, outside providers and external EMR if available.   As well as notes that were available from care everywhere and other healthcare systems.  Past medical history, social, surgical and family history all reviewed in electronic medical record.  No pertanent information unless stated regarding to the chief complaint.   Past Medical History:  Diagnosis Date   Allergy    Anxiety    Asthma    Chicken pox    GERD (gastroesophageal reflux disease)    IBS (irritable bowel syndrome)    Pneumonia 2015    No Known Allergies   Review  of Systems:  No headache, visual changes, nausea, vomiting, diarrhea, constipation,  abdominal pain, skin rash, fevers, chills, night sweats, weight loss, swollen lymph nodes, body aches, joint swelling, chest pain, shortness of breath, mood changes. POSITIVE muscle aches, dizziness  Objective  Blood pressure 110/70, pulse 66, height 5\' 6"  (1.676 m), weight 143 lb (64.9 kg), SpO2 99 %.   General: No apparent distress alert and oriented x3 mood and affect normal, dressed appropriately.  HEENT: Pupils equal, extraocular movements intact  Respiratory: Patient's speak in full sentences and does not appear short of breath  Cardiovascular: No lower extremity edema, non tender, no erythema  Neck exam does have tightness noted.  No worsening pain or any dizziness with extension of the neck.  No difficulty with compression of the carotids.  Patient was able to have relatively good sidebending.  Patient did have some dizziness today with rotation of the head to the left.  Very mild nystagmus noted with following of the eyes but EOMI.  5 out of 5 strength of all the extremities.  Osteopathic findings  C2 flexed rotated and side bent right T3 extended rotated and side bent right inhaled rib L2 flexed rotated and side bent right Sacrum right on right       Assessment and Plan:   BPPV (benign paroxysmal positional vertigo), left Seems to be left-sided.  Patient did not have any negative pain with compression of the neck vasculature.  Patient did not have any significant worsening with  patient having extension of the neck.  Seems to be more with rotational component.  5 out of 5 strength of all the extremities.  Patient knows if any worsening headache, weakness to seek medical attention but likely will do well with conservative therapy.  Patient does have Valium.  Given prednisone as well infiltrates with more of a neuritis.  Follow-up with me again as scheduled for her regular other activities.   Discussed with patient to monitor closely.  Follow-up with me as scheduled   Nonallopathic problems  Decision today to treat with OMT was based on Physical Exam  After verbal consent patient was treated with, ME, FPR techniques in cervical, rib, thoracic, lumbar, and sacral  areas avoided any HVLA secondary to patient's dizziness.  Seem to be doing relatively well otherwise.  Patient tolerated the procedure well with improvement in symptoms  Patient given exercises, stretches and lifestyle modifications  See medications in patient instructions if given  Patient will follow up in 4-8 weeks      The above documentation has been reviewed and is accurate and complete [name], DO       Note: This dictation was prepared with Dragon dictation along with smaller phrase technology. Any transcriptional errors that result from this process are unintentional.

## 2021-11-20 NOTE — Assessment & Plan Note (Signed)
Seems to be left-sided.  Patient did not have any negative pain with compression of the neck vasculature.  Patient did not have any significant worsening with patient having extension of the neck.  Seems to be more with rotational component.  5 out of 5 strength of all the extremities.  Patient knows if any worsening headache, weakness to seek medical attention but likely will do well with conservative therapy.  Patient does have Valium.  Given prednisone as well infiltrates with more of a neuritis.  Follow-up with me again as scheduled for her regular other activities.  Discussed with patient to monitor closely.  Follow-up with me as scheduled

## 2021-11-20 NOTE — Patient Instructions (Signed)
Prednisone 20mg  for 5 days Go to ED if dizziness gets worse See me again as scheduled

## 2021-11-25 NOTE — Progress Notes (Signed)
[name] [address] [place] [address] Phone: [phone] Subjective:   Jaclyn [name], am serving as a scribe for Dr. Antoine Primas.  I'm seeing this patient by the request  of:  Patient, No Pcp Per (Inactive)  CC: neck pain and vertigo   [id number]  Jaclyn [name] is a 46 y.o. female coming in with complaint of back and neck pain. OMT on 11/20/2021. Patient states doing well ready for OMT.  Patient does have some tenderness to palpation in the neck overall.  Continues to have some of the dizziness.  Very intermittent.  He does think it is getting better but still not completely resolved.  Medications patient has been prescribed: Prednisone  Taking:         Reviewed prior external information including notes and imaging from previsou exam, outside providers and external EMR if available.   As well as notes that were available from care everywhere and other healthcare systems.  Past medical history, social, surgical and family history all reviewed in electronic medical record.  No pertanent information unless stated regarding to the chief complaint.   Past Medical History:  Diagnosis Date   Allergy    Anxiety    Asthma    Chicken pox    GERD (gastroesophageal reflux disease)    IBS (irritable bowel syndrome)    Pneumonia 2015    No Known Allergies   Review of Systems:  No headache, visual changes, nausea, vomiting, diarrhea, constipation,  abdominal pain, skin rash, fevers, chills, night sweats, weight loss, swollen lymph nodes, body aches, joint swelling, chest pain, shortness of breath, mood changes. POSITIVE muscle aches, intermittent dizziness  Objective  Blood pressure 90/60, pulse 79, height 5\' 6"  (1.676 m), SpO2 99 %.   General: No apparent distress alert and oriented x3 mood and affect normal, dressed appropriately.  HEENT: Pupils equal, extraocular movements intact  Respiratory: Patient's speak in full  sentences and does not appear short of breath  Cardiovascular: No lower extremity edema, non tender, no erythema  Hypermobility noted, palpable tightness noted in the parascapular region right greater than left.  Neck exam mild tightness noted also on the right side.  Osteopathic findings  C3 flexed rotated and side bent right T3 extended rotated and side bent right inhaled rib T9 extended rotated and side bent left L2 flexed rotated and side bent right Sacrum right on right       Assessment and Plan:  BPPV (benign paroxysmal positional vertigo), left Started prednisone, discussed HEP  Discussed worsening symptoms may need a possible do advanced imaging or to seek medical attention.  Follow-up with me again in 4 to 6 weeks otherwise.  Depo-Medrol given today.  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  Patient responded extremely well to very mild varus osteopathic manipulation.  Discussed which activities to doing which wants to avoid.  Follow-up again in 6 to 8 weeks.   Nonallopathic problems  Decision today to treat with OMT was based on Physical Exam  After verbal consent patient was treated with HVLA, ME, FPR techniques in cervical, rib, thoracic, lumbar, and sacral  areas  Patient tolerated the procedure well with improvement in symptoms  Patient given exercises, stretches and lifestyle modifications  See medications in patient instructions if given  Patient will follow up in 4-8 weeks      The above documentation has been reviewed and is accurate and complete , DO  Note: This dictation was prepared with Dragon dictation along with smaller phrase technology. Any transcriptional errors that result from this process are unintentional.

## 2021-11-26 ENCOUNTER — Other Ambulatory Visit: Payer: Self-pay

## 2021-11-26 ENCOUNTER — Encounter: Payer: Self-pay | Admitting: Family Medicine

## 2021-11-26 ENCOUNTER — Ambulatory Visit (INDEPENDENT_AMBULATORY_CARE_PROVIDER_SITE_OTHER): Payer: 59 | Admitting: Family Medicine

## 2021-11-26 VITALS — BP 90/60 | HR 79 | Ht 66.0 in

## 2021-11-26 DIAGNOSIS — M9901 Segmental and somatic dysfunction of cervical region: Secondary | ICD-10-CM

## 2021-11-26 DIAGNOSIS — M9902 Segmental and somatic dysfunction of thoracic region: Secondary | ICD-10-CM

## 2021-11-26 DIAGNOSIS — M9903 Segmental and somatic dysfunction of lumbar region: Secondary | ICD-10-CM

## 2021-11-26 DIAGNOSIS — M9904 Segmental and somatic dysfunction of sacral region: Secondary | ICD-10-CM | POA: Diagnosis not present

## 2021-11-26 DIAGNOSIS — M940 Chondrocostal junction syndrome [Tietze]: Secondary | ICD-10-CM | POA: Diagnosis not present

## 2021-11-26 DIAGNOSIS — H8112 Benign paroxysmal vertigo, left ear: Secondary | ICD-10-CM | POA: Diagnosis not present

## 2021-11-26 MED ORDER — METHYLPREDNISOLONE ACETATE 80 MG/ML IJ SUSP
80.0000 mg | Freq: Once | INTRAMUSCULAR | Status: AC
  Administered 2021-11-26: 80 mg via INTRAMUSCULAR

## 2021-11-26 MED ORDER — PREDNISONE 20 MG PO TABS: 20.0000 mg | ORAL_TABLET | Freq: Every day | ORAL | 0 refills | Status: DC

## 2021-11-26 NOTE — Assessment & Plan Note (Signed)
Chronic problem with very mild.  Patient responded extremely well to very mild varus osteopathic manipulation.  Discussed which activities to doing which wants to avoid.  Follow-up again in 6 to 8 weeks.

## 2021-11-26 NOTE — Patient Instructions (Addendum)
Good to see you  ?Injection given today  ?Follow up in 5-6 weeks  ?

## 2021-11-26 NOTE — Assessment & Plan Note (Signed)
Started prednisone, discussed HEP  Discussed worsening symptoms may need a possible do advanced imaging or to seek medical attention.  Follow-up with me again in 4 to 6 weeks otherwise.  Depo-Medrol given today.

## 2021-12-21 ENCOUNTER — Encounter: Payer: Self-pay | Admitting: Family Medicine

## 2021-12-30 NOTE — Progress Notes (Signed)
?[facility] D.O. ? [facility] ?[address] [place] [address] ?Phone: [phone] ?Subjective:   ?I, Nadine Counts, am serving as a Neurosurgeon for Dr. Antoine Primas. ?This visit occurred during the SARS-CoV-2 public health emergency.  Safety protocols were in place, including screening questions prior to the visit, additional usage of staff PPE, and extensive cleaning of exam room while observing appropriate contact time as indicated for disinfecting solutions.  ? ?I'm seeing this patient by the request  of:  Patient, No Pcp Per (Inactive) ? ?CC: Neck and back pain follow-up ? ?[id number]  ?Jaclyn [name] is a 46 y.o. female coming in with complaint of back and neck pain. OMT on 11/26/2021. Patient states same per usual. No new complaints. ? ?Medications patient has been prescribed: Prednisone ? ?Taking: ? ? ?  ? ? ? ? ?Reviewed prior external information including notes and imaging from previsou exam, outside providers and external EMR if available.  ? ?As well as notes that were available from care everywhere and other healthcare systems. ? ?Past medical history, social, surgical and family history all reviewed in electronic medical record.  No pertanent information unless stated regarding to the chief complaint.  ? ?Past Medical History:  ?Diagnosis Date  ? Allergy   ? Anxiety   ? Asthma   ? Chicken pox   ? GERD (gastroesophageal reflux disease)   ? IBS (irritable bowel syndrome)   ? Pneumonia 2015  ?  ?No Known Allergies ? ? ?Review of Systems: ? No headache, visual changes, nausea, vomiting, diarrhea, constipation, dizziness, abdominal pain, skin rash, fevers, chills, night sweats, weight loss, swollen lymph nodes, body aches, joint swelling, chest pain, shortness of breath, mood changes. POSITIVE muscle aches ? ?Objective  ?Blood pressure 98/64, pulse 67, height 5\' 6"  (1.676 m), weight 147 lb (66.7 kg), SpO2 99 %. ?  ?General: No apparent distress alert and oriented x3 mood and affect  normal, dressed appropriately.  ?HEENT: Pupils equal, extraocular movements intact  ?Respiratory: Patient's speak in full sentences and does not appear short of breath  ?Cardiovascular: No lower extremity edema, non tender, no erythema  ?Low back does have some loss of lordosis. ?Patient does have tightness in the parascapular area. ? ? ?Osteopathic findings ? ?C2 flexed rotated and side bent right ?C6 flexed rotated and side bent left ?T3 extended rotated and side bent right inhaled rib ?T8 extended rotated and side bent left ?L2 flexed rotated and side bent right ?Sacrum right on right ? ? ? ? ?  ?Assessment and Plan: ? 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  Does have the muscle relaxer when needed.  Is responding well to manipulation.  Still having some difficulty with fatigue.  Patient does do a lot of manual massage therapy that I do think contributes to some of the aches that she has.  Follow-up with me again in 6 to 8 weeks  ? ?Nonallopathic problems ? ?Decision today to treat with OMT was based on Physical Exam ? ?After verbal consent patient was treated with HVLA, ME, FPR techniques in cervical, rib, thoracic, lumbar, and sacral  areas ? ?Patient tolerated the procedure well with improvement in symptoms ? ?Patient given exercises, stretches and lifestyle modifications ? ?See medications in patient instructions if given ? ?Patient will follow up in 4-8 weeks ? ?  ? ? ?The above documentation has been reviewed and is accurate and complete , DO ? ? ? ?  ? ?  Note: This dictation was prepared with Dragon dictation along with smaller phrase technology. Any transcriptional errors that result from this process are unintentional.    ?  ?  ? ?

## 2021-12-31 ENCOUNTER — Ambulatory Visit: Payer: 59 | Admitting: Family Medicine

## 2021-12-31 ENCOUNTER — Other Ambulatory Visit: Payer: Self-pay

## 2021-12-31 VITALS — BP 98/64 | HR 67 | Ht 66.0 in | Wt 147.0 lb

## 2021-12-31 DIAGNOSIS — M940 Chondrocostal junction syndrome [Tietze]: Secondary | ICD-10-CM

## 2021-12-31 DIAGNOSIS — M9901 Segmental and somatic dysfunction of cervical region: Secondary | ICD-10-CM

## 2021-12-31 DIAGNOSIS — M9903 Segmental and somatic dysfunction of lumbar region: Secondary | ICD-10-CM

## 2021-12-31 DIAGNOSIS — M9904 Segmental and somatic dysfunction of sacral region: Secondary | ICD-10-CM | POA: Diagnosis not present

## 2021-12-31 DIAGNOSIS — M9902 Segmental and somatic dysfunction of thoracic region: Secondary | ICD-10-CM

## 2021-12-31 DIAGNOSIS — M9908 Segmental and somatic dysfunction of rib cage: Secondary | ICD-10-CM

## 2021-12-31 NOTE — Patient Instructions (Signed)
See you again in 6 weeks Look into [facility] instead of [facility]

## 2021-12-31 NOTE — Assessment & Plan Note (Signed)
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  Does have the muscle relaxer when needed.  Is responding well to manipulation.  Still having some difficulty with fatigue.  Patient does do a lot of manual massage therapy that I do think contributes to some of the aches that she has.  Follow-up with me again in 6 to 8 weeks ?

## 2022-02-02 NOTE — Progress Notes (Signed)
?  Jaclyn [name] D.O. ?[facility] ?[address] [place] [address] ?Phone: [phone] ?Subjective:   ?I, Debbe Odea, am serving as a scribe for Dr. Antoine Primas. ? ?I'm seeing this patient by the request  of:  Patient, No Pcp Per (Inactive) ? ?CC: Neck and back pain follow-up ? ?[id number]  ?Jaclyn [name] is a 46 y.o. female coming in with complaint of back and neck pain. OMT 12/31/2021. 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 ?Medications patient has been prescribed: None ? ?Taking: ? ? ?  ? ? ? ? ?Reviewed prior external information including notes and imaging from previsou exam, outside providers and external EMR if available.  ? ?As well as notes that were available from care everywhere and other healthcare systems. ? ?Past medical history, social, surgical and family history all reviewed in electronic medical record.  No pertanent information unless stated regarding to the chief complaint.  ? ?Past Medical History:  ?Diagnosis Date  ? Allergy   ? Anxiety   ? Asthma   ? Chicken pox   ? GERD (gastroesophageal reflux disease)   ? IBS (irritable bowel syndrome)   ? Pneumonia 2015  ?  ?No Known Allergies ? ? ?Review of Systems: ? No headache, visual changes, nausea, vomiting, diarrhea, constipation, dizziness, abdominal pain, skin rash, fevers, chills, night sweats, weight loss, swollen lymph nodes, body aches, joint swelling, chest pain, shortness of breath, mood changes. POSITIVE muscle aches ? ?Objective  ?Blood pressure 110/70, pulse 70, height 5\' 6"  (1.676 m), weight 138 lb (62.6 kg), SpO2 99 %. ?  ?General: No apparent distress alert and oriented x3 mood and affect normal, dressed appropriately.  ?HEENT: Pupils equal, extraocular movements intact  ?Respiratory: Patient's speak in full sentences and does not appear short of breath  ?Cardiovascular: No lower extremity edema, non tender, no erythema  ?Increase pain and tightness sin the axial skeleton, significantly different  for this individual.  ?Neck exam does show some loss of lordosis.  Patient though does have hypermobility of multiple other joints.  Continues to have increasing discomfort and pain in the parascapular region right greater than left. ? ?Osteopathic findings ? ?C2 flexed rotated and side bent right ?C7 flexed rotated and side bent left ?T3 extended rotated and side bent right inhaled rib ?T6 extended rotated and side bent left inhaled rib  ?L2 flexed rotated and side bent right ?Sacrum right on right ? ? ? ? ?  ?Assessment and Plan: ? ? ?Slipped rib syndrome ?Chronic with exacerbation. Encouraged HEP, encouraged injections per orders, discussed meds and sent in prednisone.  ?RTC in 6-8 weeks.  ?? Long covid? ?  ?Nonallopathic problems ? ?Decision today to treat with OMT was based on Physical Exam ? ?After verbal consent patient was treated with HVLA, ME, FPR techniques in cervical, rib, thoracic, lumbar, and sacral  areas ? ?Patient tolerated the procedure well with improvement in symptoms ? ?Patient given exercises, stretches and lifestyle modifications ? ?See medications in patient instructions if given ? ?Patient will follow up in 4-8 weeks ? ?  ? ? ?The above documentation has been reviewed and is accurate and complete , DO ? ? ? ?  ? ? Note: This dictation was prepared with Dragon dictation along with smaller phrase technology. Any transcriptional errors that result from this process are unintentional.    ?  ?  ? ?

## 2022-02-03 ENCOUNTER — Ambulatory Visit: Payer: 59 | Admitting: Family Medicine

## 2022-02-03 ENCOUNTER — Ambulatory Visit (INDEPENDENT_AMBULATORY_CARE_PROVIDER_SITE_OTHER): Payer: 59 | Admitting: Family Medicine

## 2022-02-03 VITALS — BP 110/70 | HR 70 | Ht 66.0 in | Wt 138.0 lb

## 2022-02-03 DIAGNOSIS — M9908 Segmental and somatic dysfunction of rib cage: Secondary | ICD-10-CM | POA: Diagnosis not present

## 2022-02-03 DIAGNOSIS — M9901 Segmental and somatic dysfunction of cervical region: Secondary | ICD-10-CM

## 2022-02-03 DIAGNOSIS — M940 Chondrocostal junction syndrome [Tietze]: Secondary | ICD-10-CM

## 2022-02-03 DIAGNOSIS — M9904 Segmental and somatic dysfunction of sacral region: Secondary | ICD-10-CM | POA: Diagnosis not present

## 2022-02-03 DIAGNOSIS — M9902 Segmental and somatic dysfunction of thoracic region: Secondary | ICD-10-CM

## 2022-02-03 DIAGNOSIS — M9903 Segmental and somatic dysfunction of lumbar region: Secondary | ICD-10-CM

## 2022-02-03 IMAGING — DX DG RIBS 2V*R*
2 series · 2 of 2 positions shown · non-contrast
Comparison: None.

CLINICAL DATA: Right rib pain

EXAM:
RIGHT RIBS - 2 VIEW

[rib ap]
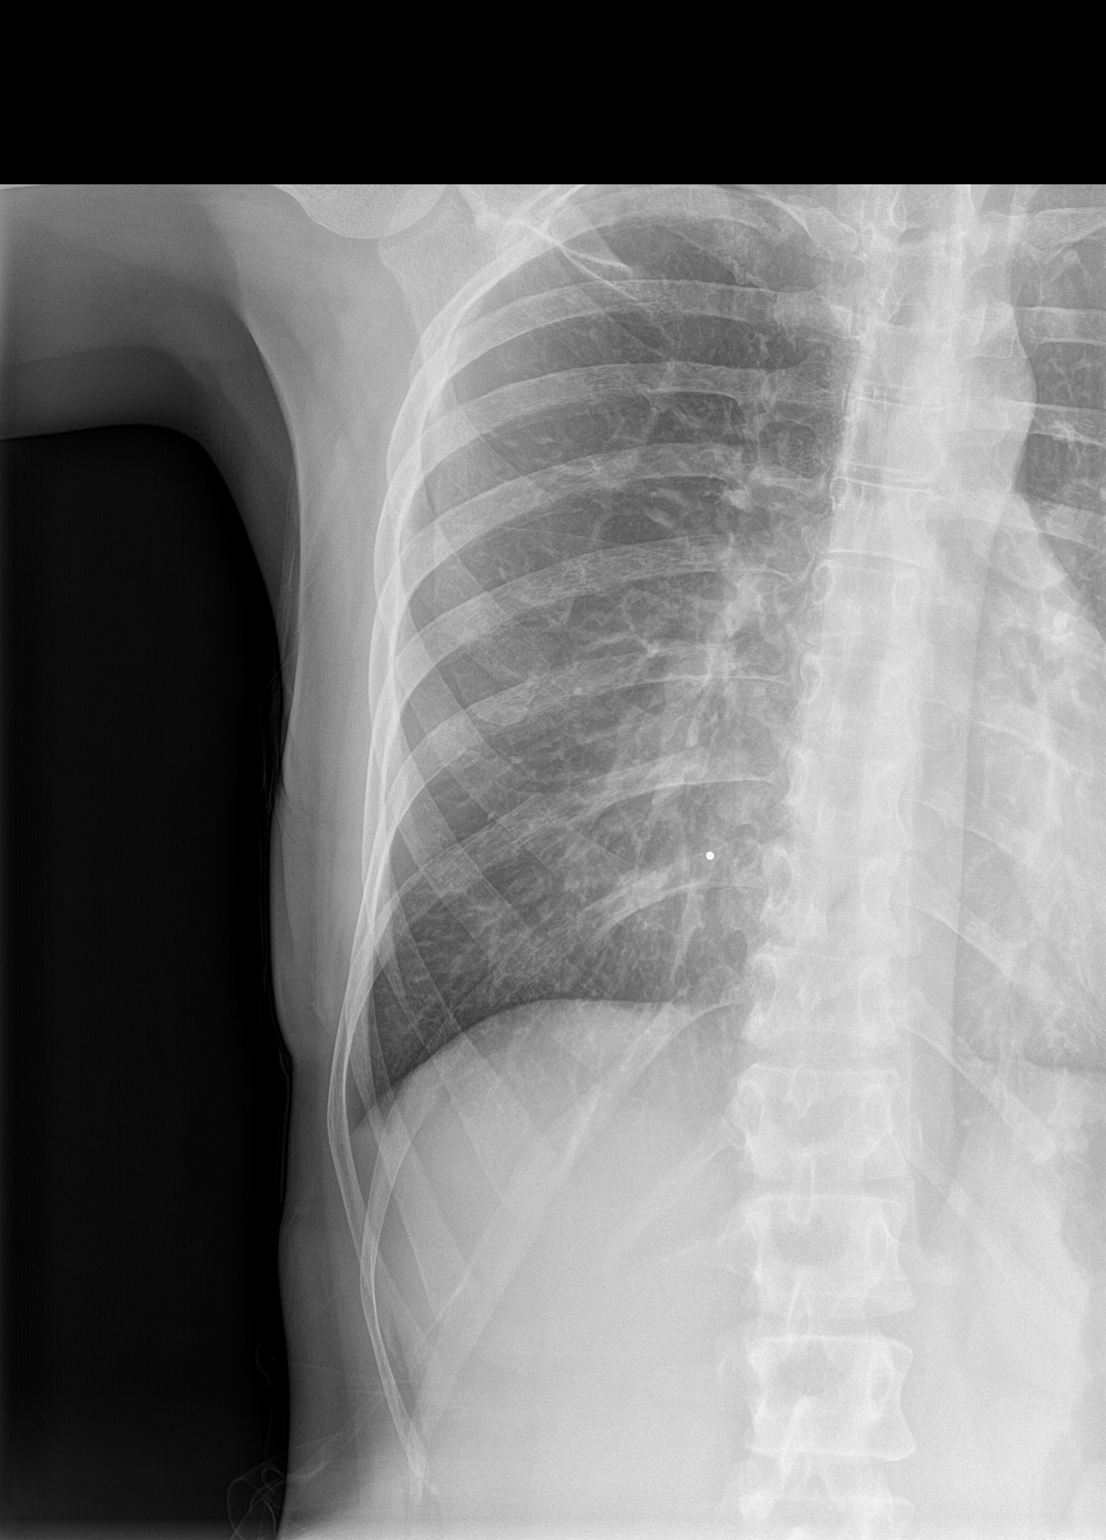

[rib obl]
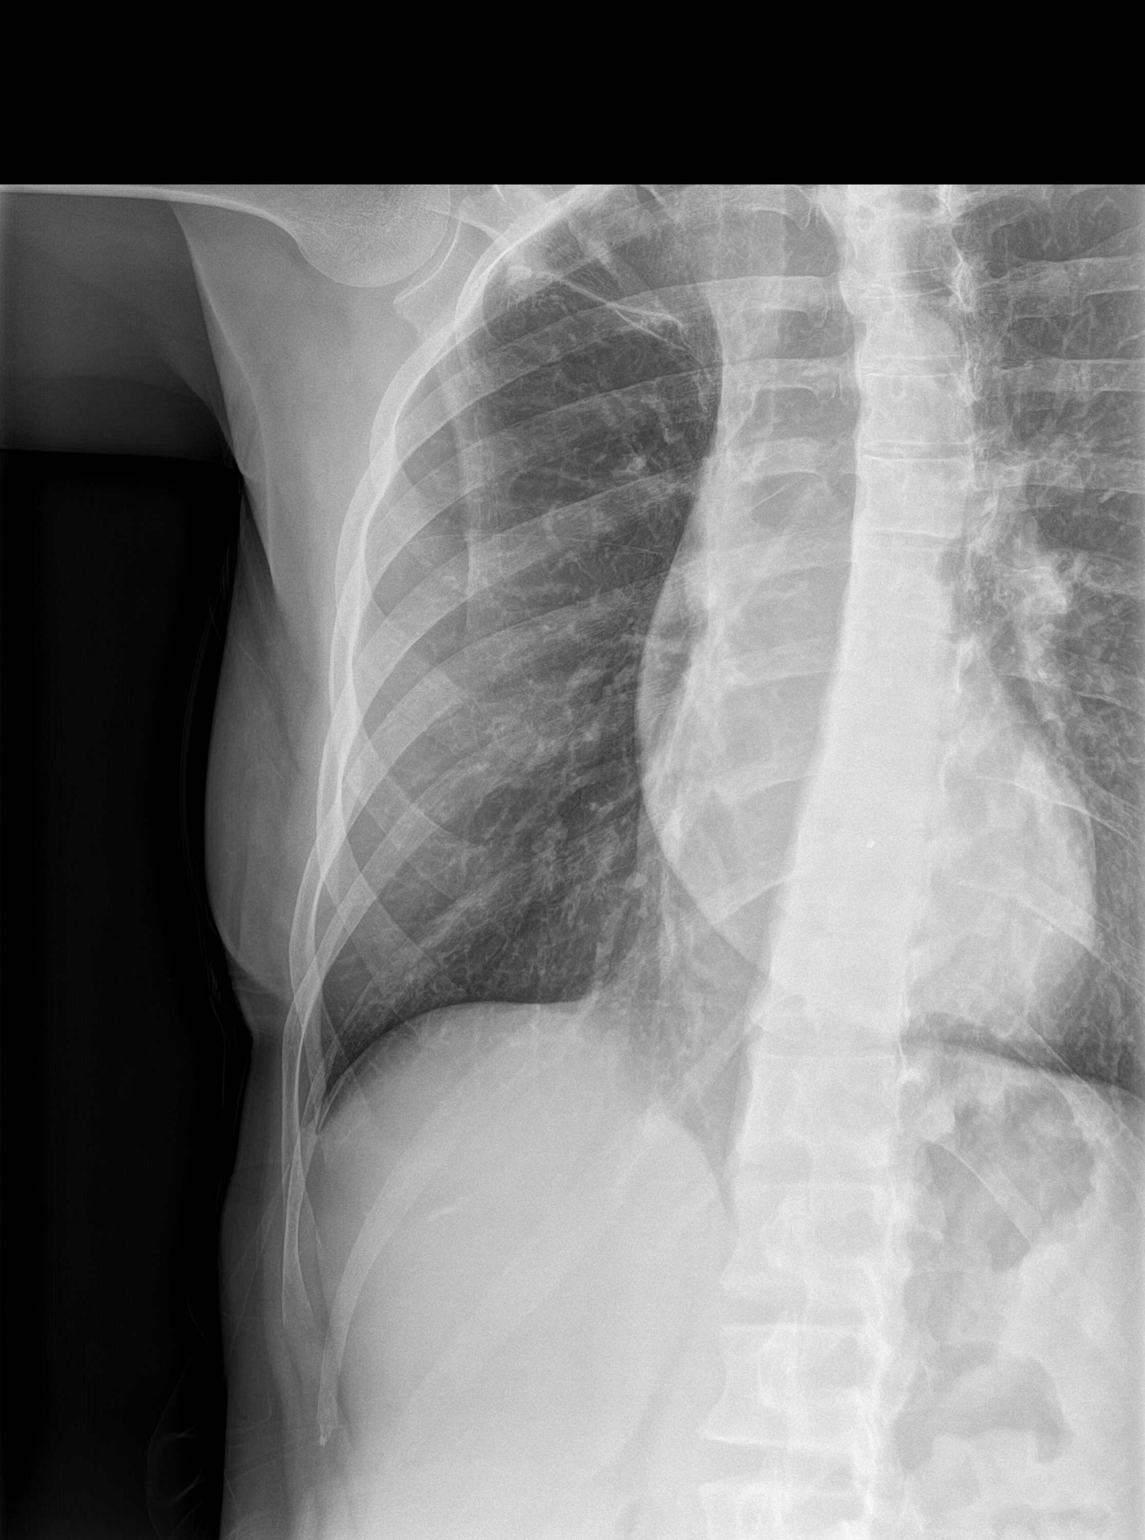

[2 of 2 positions shown; findings below may reference images not displayed]

FINDINGS: No fracture or other bone lesions are seen involving the ribs.
IMPRESSION: Negative.

## 2022-02-03 IMAGING — DX DG THORACIC SPINE 2V
3 series · 3 of 3 positions shown · non-contrast
Comparison: None.

CLINICAL DATA: Back pain

EXAM:
THORACIC SPINE 2 VIEWS

[t-spine ap]
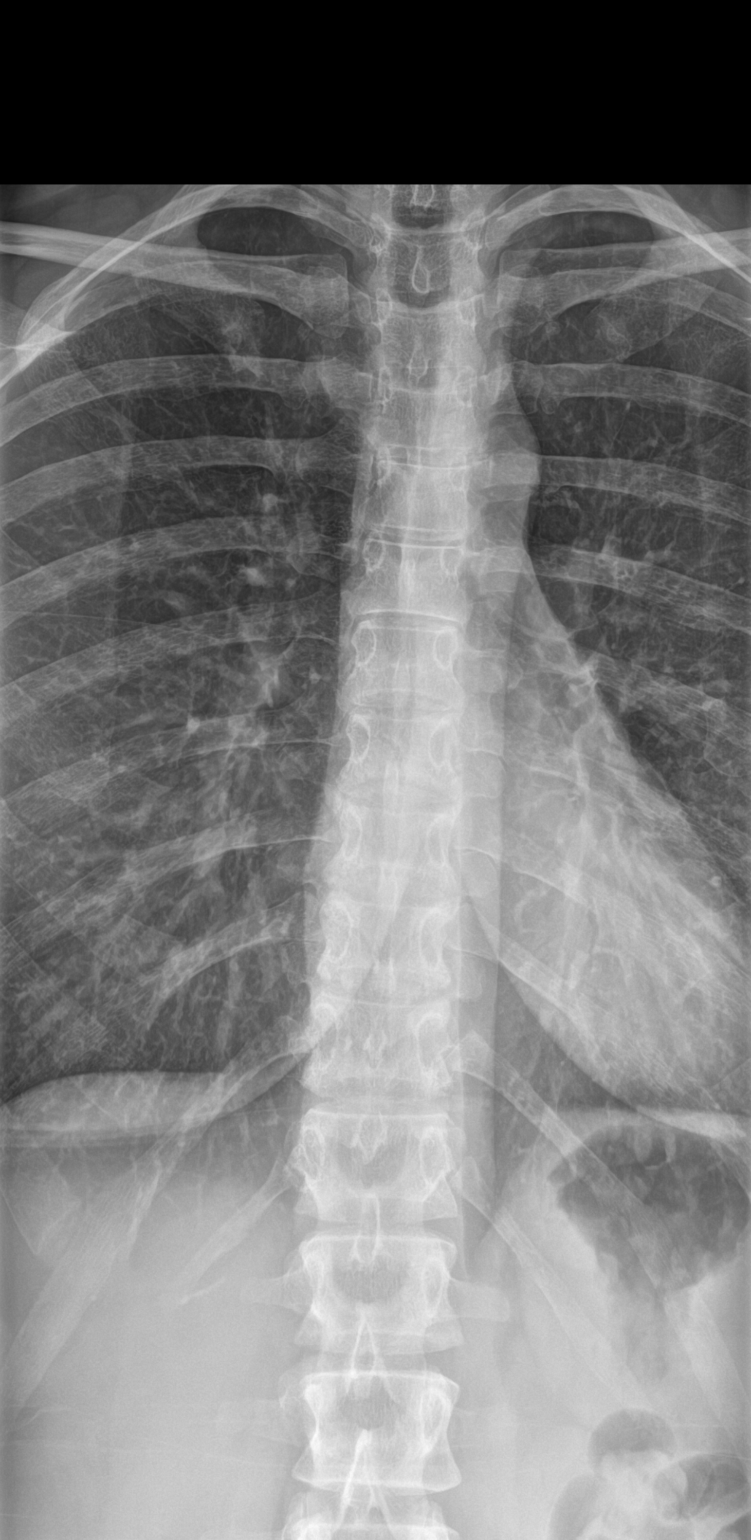

[t-spine lat]
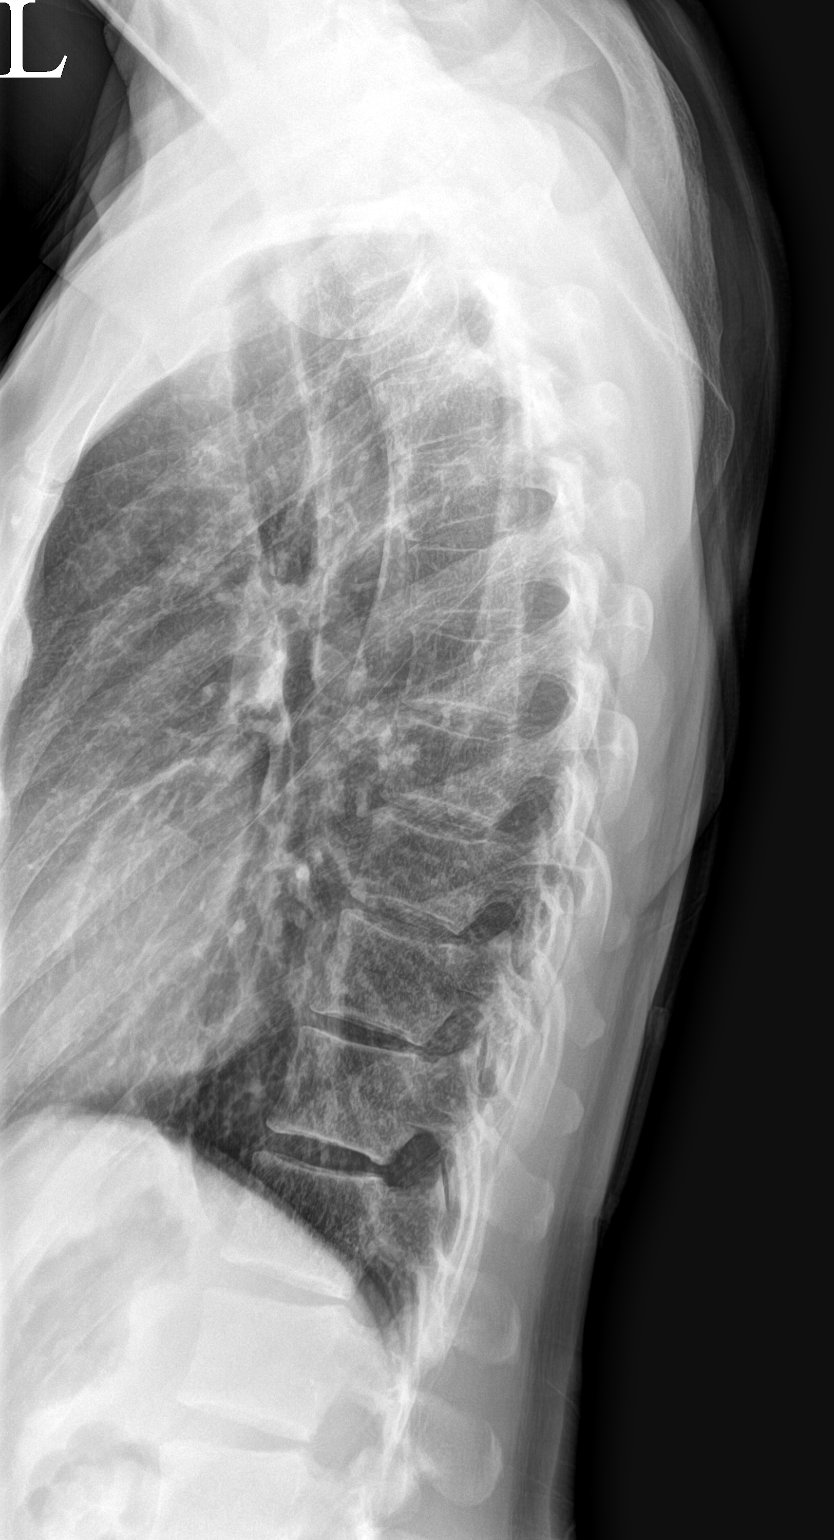

[ct-spine swimmers]
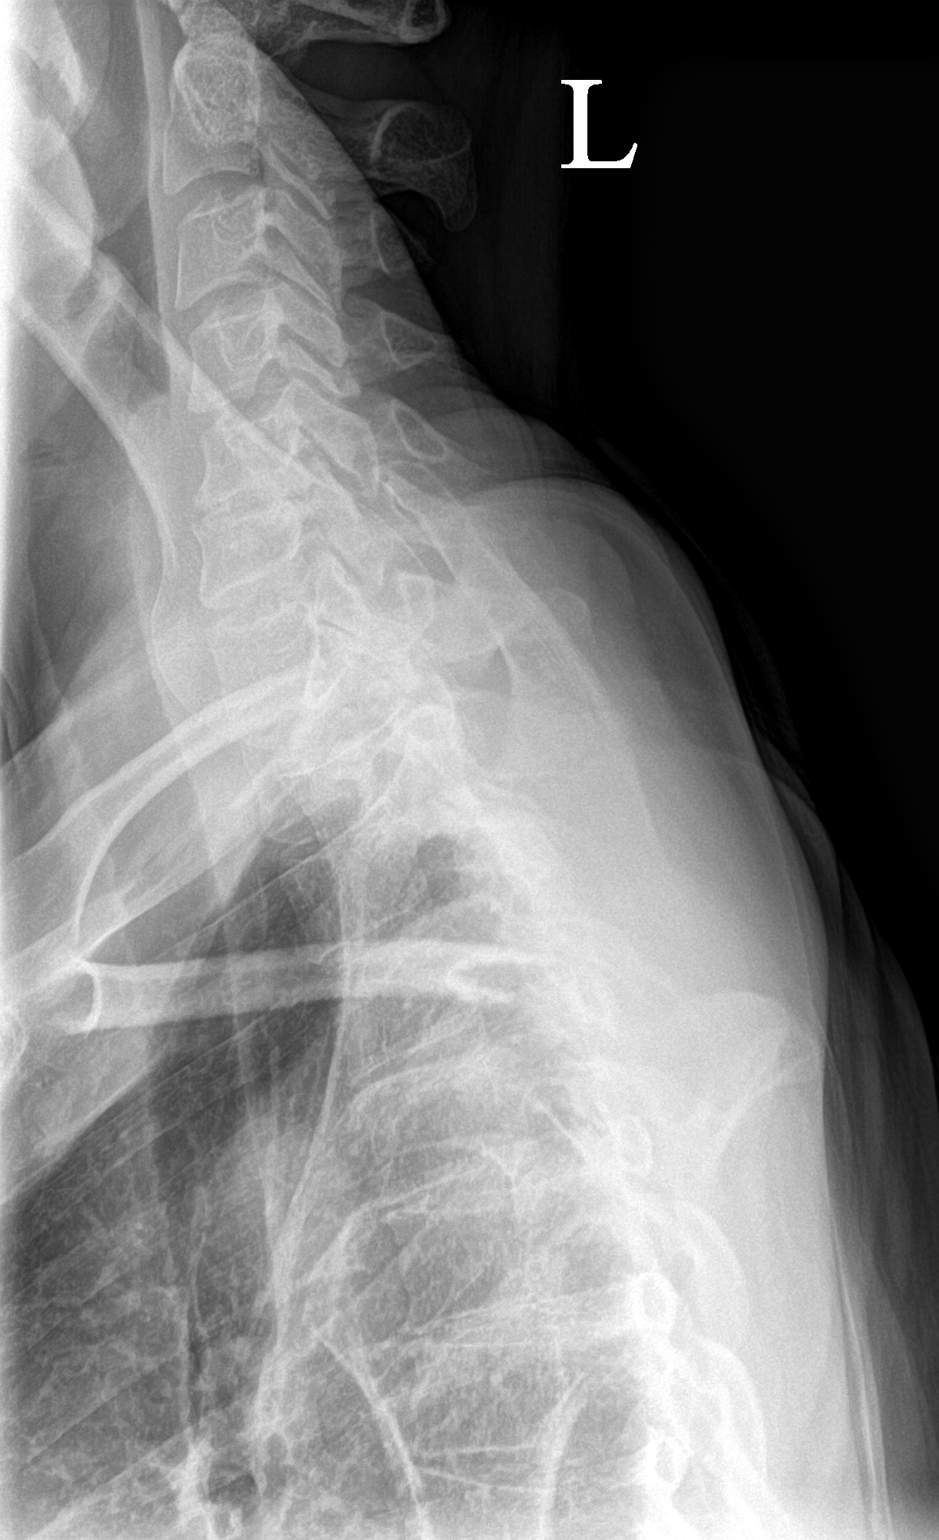

[3 of 3 positions shown; findings below may reference images not displayed]

FINDINGS: There is no evidence of thoracic spine fracture. Alignment is
normal. Intervertebral disc heights are preserved. No other
significant bone abnormalities are identified.
IMPRESSION: Negative.

## 2022-02-03 MED ORDER — METHYLPREDNISOLONE ACETATE 40 MG/ML IJ SUSP
40.0000 mg | Freq: Once | INTRAMUSCULAR | Status: AC
  Administered 2022-02-03: 40 mg via INTRAMUSCULAR

## 2022-02-03 MED ORDER — PREDNISONE 20 MG PO TABS: 20.0000 mg | ORAL_TABLET | Freq: Two times a day (BID) | ORAL | 0 refills | Status: DC

## 2022-02-03 MED ORDER — KETOROLAC TROMETHAMINE 30 MG/ML IJ SOLN
30.0000 mg | Freq: Once | INTRAMUSCULAR | Status: AC
  Administered 2022-02-03: 30 mg via INTRAMUSCULAR

## 2022-02-03 NOTE — Patient Instructions (Addendum)
Good to see you  ?Injections given today  ?Prednisone 40mg  for five days ?Follow up in 5-6 weeks  ?

## 2022-02-04 NOTE — Assessment & Plan Note (Signed)
Chronic with exacerbation. Encouraged HEP, encouraged injections per orders, discussed meds and sent in prednisone.  ?RTC in 6-8 weeks.  ?? Long covid? ?

## 2022-03-16 NOTE — Progress Notes (Unsigned)
Jaclyn [name] Jaclyn [name] [address] [place] Phone: [phone] Subjective:   IVilma [name], am serving as a scribe for Dr. Hulan Saas.  I'm seeing this patient by the request  of:  Patient, No Pcp Per (Inactive)  CC: Chronic back pain  [id number]  Jaclyn [name] is a 46 y.o. female coming in with complaint of back and neck pain. OMT on 02/03/2022. Patient states same per usual. No new complaints has had some more tightness recently because she has been working a significant morning.  Medications patient has been prescribed: Prednisone and Zanaflex intermittently  Taking: No         Reviewed prior external information including notes and imaging from previsou exam, outside providers and external EMR if available.   As well as notes that were available from care everywhere and other healthcare systems.  Past medical history, social, surgical and family history all reviewed in electronic medical record.  No pertanent information unless stated regarding to the chief complaint.   Past Medical History:  Diagnosis Date   Allergy    Anxiety    Asthma    Chicken pox    GERD (gastroesophageal reflux disease)    IBS (irritable bowel syndrome)    Pneumonia 2015    No Known Allergies   Review of Systems:  No headache, visual changes, nausea, vomiting, diarrhea, constipation, dizziness, abdominal pain, skin rash, fevers, chills, night sweats, weight loss, swollen lymph nodes, body aches, joint swelling, chest pain, shortness of breath, mood changes. POSITIVE muscle aches  Objective  Blood pressure 118/70, pulse 73, height 5\' 6"  (1.676 m), weight 146 lb (66.2 kg), SpO2 97 %.   General: No apparent distress alert and oriented x3 mood and affect normal, dressed appropriately.  HEENT: Pupils equal, extraocular movements intact  Respiratory: Patient's speak in full sentences and does not appear short of breath  Cardiovascular: No lower  extremity edema, non tender, no erythema  Back exam still shows tightness noted in the parascapular region right greater than left.  Patient does have some mild scapular dyskinesis noted.  More tightness noted in the parascapular region as well as the thoracolumbar juncture.  Osteopathic findings  C2 flexed rotated and side bent right C6 flexed rotated and side bent left T3 extended rotated and side bent right inhaled rib T9 extended rotated and side bent left L2 flexed rotated and side bent right Sacrum right on right       Assessment and Plan:  Slipped rib syndrome Sleep is intermittent.  More stress recently.  Likely indicated.  Discussed which activities to do and which ones to avoid.  Patient is somewhat limited.  Has been more busy recently.  Follow-up again in 6 to 8 weeks   Nonallopathic problems  Decision today to treat with OMT was based on Physical Exam  After verbal consent patient was treated with HVLA, ME, FPR techniques in cervical, rib, thoracic, lumbar, and sacral  areas  Patient tolerated the procedure well with improvement in symptoms  Patient given exercises, stretches and lifestyle modifications  See medications in patient instructions if given  Patient will follow up in 4-8 weeks      The above documentation has been reviewed and is accurate and complete Jaclyn Pulley, DO        Note: This dictation was prepared with Dragon dictation along with smaller phrase technology. Any transcriptional errors that result from this process are unintentional.

## 2022-03-17 ENCOUNTER — Ambulatory Visit: Payer: 59 | Admitting: Family Medicine

## 2022-03-17 VITALS — BP 118/70 | HR 73 | Ht 66.0 in | Wt 146.0 lb

## 2022-03-17 DIAGNOSIS — M9904 Segmental and somatic dysfunction of sacral region: Secondary | ICD-10-CM | POA: Diagnosis not present

## 2022-03-17 DIAGNOSIS — M940 Chondrocostal junction syndrome [Tietze]: Secondary | ICD-10-CM | POA: Diagnosis not present

## 2022-03-17 DIAGNOSIS — M9902 Segmental and somatic dysfunction of thoracic region: Secondary | ICD-10-CM | POA: Diagnosis not present

## 2022-03-17 DIAGNOSIS — M9901 Segmental and somatic dysfunction of cervical region: Secondary | ICD-10-CM

## 2022-03-17 DIAGNOSIS — M9903 Segmental and somatic dysfunction of lumbar region: Secondary | ICD-10-CM

## 2022-03-17 DIAGNOSIS — M9908 Segmental and somatic dysfunction of rib cage: Secondary | ICD-10-CM | POA: Diagnosis not present

## 2022-03-17 NOTE — Patient Instructions (Signed)
Good to see you! Good luck with Jaclyn [name] If you need anything don't hesitate to contact us See you again in 7-8 weeks

## 2022-03-17 NOTE — Assessment & Plan Note (Signed)
Sleep is intermittent.  More stress recently.  Likely indicated.  Discussed which activities to do and which ones to avoid.  Patient is somewhat limited.  Has been more busy recently.  Follow-up again in 6 to 8 weeks

## 2022-03-28 ENCOUNTER — Encounter: Payer: Self-pay | Admitting: Family Medicine

## 2022-03-29 MED ORDER — HYDROXYZINE HCL 10 MG PO TABS: 10.0000 mg | ORAL_TABLET | Freq: Three times a day (TID) | ORAL | 0 refills | Status: AC | PRN

## 2022-04-15 NOTE — Progress Notes (Signed)
[name] [address] [place] [address] Phone: [phone] Subjective:   Jaclyn [name], am serving as a scribe for Dr. Antoine Primas.  I'm seeing this patient by the request  of:  Patient, No Pcp Per  CC: Back and neck pain follow-up  [id number]  Jaclyn [name] is a 46 y.o. female coming in with complaint of back and neck pain. OMT on 03/17/2022. Patient states that she is okay , just needs a tune up also having to have left wrist pain.  Has some numbness in the fingers.  Medications patient has been prescribed: Prednisone Hydroxyzine  Taking:         Reviewed prior external information including notes and imaging from previsou exam, outside providers and external EMR if available.   As well as notes that were available from care everywhere and other healthcare systems.  Past medical history, social, surgical and family history all reviewed in electronic medical record.  No pertanent information unless stated regarding to the chief complaint.   Past Medical History:  Diagnosis Date   Allergy    Anxiety    Asthma    Chicken pox    GERD (gastroesophageal reflux disease)    IBS (irritable bowel syndrome)    Pneumonia 2015    No Known Allergies   Review of Systems:  No headache, visual changes, nausea, vomiting, diarrhea, constipation, dizziness, abdominal pain, skin rash, fevers, chills, night sweats, weight loss, swollen lymph nodes, body aches, joint swelling, chest pain, shortness of breath, mood changes. POSITIVE muscle aches  Objective  Blood pressure 122/78, pulse 84, height 5\' 6"  (1.676 m), weight 146 lb (66.2 kg), SpO2 99 %.   General: No apparent distress alert and oriented x3 mood and affect normal, dressed appropriately.  HEENT: Pupils equal, extraocular movements intact  Respiratory: Patient's speak in full sentences and does not appear short of breath  Cardiovascular: No lower extremity edema, non tender,  no erythema  Gait MSK:  Neck exam still has some tightness of the neck.  Patient does have some scapular dyskinesis right greater than left.  Slipped ribs felt on the right side. Left wrist exam shows the patient does have a positive Tinel's noted.  Positive Phalen's noted.  No thenar eminence wasting noted though.   Procedure: Real-time Ultrasound Guided Injection of the left carpal tunnel Device: GE Logiq Q7 Ultrasound guided injection is preferred based studies that show increased duration, increased effect, greater accuracy, decreased procedural pain, increased response rate with ultrasound guided versus blind injection.  Verbal informed consent obtained.  Time-out conducted.  Noted no overlying erythema, induration, or other signs of local infection.  Skin prepped in a sterile fashion.  Local anesthesia: Topical Ethyl chloride.  With sterile technique and under real time ultrasound guidance:  median nerve visualized.  23g 5/8 inch needle inserted distal to proximal approach into nerve sheath. Pictures taken nfor needle placement. Patient did have injection of 0.5 cc of 0.5% Marcaine, and 0.5 cc of Kenalog 40 mg/dL. Completed without difficulty  Pain immediately resolved suggesting accurate placement of the medication.  Advised to call if fevers/chills, erythema, induration, drainage, or persistent bleeding.  Images permanently stored and available for review in the ultrasound unit.  Impression: Technically successful ultrasound guided injection.   Osteopathic findings  C2 flexed rotated and side bent right C6 flexed rotated and side bent right T3 extended rotated and side bent right inhaled rib T9 extended rotated and side bent left L2 flexed rotated  and side bent right Sacrum right on right       Assessment and Plan:  Slipped rib syndrome Chronic problem with another exacerbation.  Discussed with patient to continue to work on the scapular stabilization exercises.  No  significant change in medications.  Does have the Zanaflex for breakthrough pain.  Follow-up again in 6 to 8 weeks    Nonallopathic problems  Decision today to treat with OMT was based on Physical Exam  After verbal consent patient was treated with HVLA, ME, FPR techniques in cervical, rib, thoracic, lumbar, and sacral  areas  Patient tolerated the procedure well with improvement in symptoms  Patient given exercises, stretches and lifestyle modifications  See medications in patient instructions if given  Patient will follow up in 4-8 weeks    The above documentation has been reviewed and is accurate and complete [name], DO          Note: This dictation was prepared with Dragon dictation along with smaller phrase technology. Any transcriptional errors that result from this process are unintentional.

## 2022-04-21 ENCOUNTER — Ambulatory Visit: Payer: 59 | Admitting: Family Medicine

## 2022-04-21 ENCOUNTER — Ambulatory Visit: Payer: Self-pay

## 2022-04-21 VITALS — BP 122/78 | HR 84 | Ht 66.0 in | Wt 146.0 lb

## 2022-04-21 DIAGNOSIS — M9902 Segmental and somatic dysfunction of thoracic region: Secondary | ICD-10-CM | POA: Diagnosis not present

## 2022-04-21 DIAGNOSIS — M25532 Pain in left wrist: Secondary | ICD-10-CM | POA: Diagnosis not present

## 2022-04-21 DIAGNOSIS — G5602 Carpal tunnel syndrome, left upper limb: Secondary | ICD-10-CM | POA: Diagnosis not present

## 2022-04-21 DIAGNOSIS — M9903 Segmental and somatic dysfunction of lumbar region: Secondary | ICD-10-CM

## 2022-04-21 DIAGNOSIS — M940 Chondrocostal junction syndrome [Tietze]: Secondary | ICD-10-CM | POA: Diagnosis not present

## 2022-04-21 DIAGNOSIS — M9901 Segmental and somatic dysfunction of cervical region: Secondary | ICD-10-CM

## 2022-04-21 DIAGNOSIS — M9908 Segmental and somatic dysfunction of rib cage: Secondary | ICD-10-CM

## 2022-04-21 DIAGNOSIS — M9904 Segmental and somatic dysfunction of sacral region: Secondary | ICD-10-CM

## 2022-04-21 NOTE — Patient Instructions (Signed)
Wear brace at night See me in 5-6 weeks

## 2022-04-23 DIAGNOSIS — G5602 Carpal tunnel syndrome, left upper limb: Secondary | ICD-10-CM | POA: Insufficient documentation

## 2022-04-23 NOTE — Assessment & Plan Note (Signed)
Patient given injection and tolerated the procedure well, discussed icing regimen and home exercises.  Discussed icing regimen.  We will follow-up again in 6 to 8 weeks

## 2022-04-23 NOTE — Assessment & Plan Note (Signed)
Chronic problem with another exacerbation.  Discussed with patient to continue to work on the scapular stabilization exercises.  No significant change in medications.  Does have the Zanaflex for breakthrough pain.  Follow-up again in 6 to 8 weeks

## 2022-05-05 ENCOUNTER — Encounter: Payer: Self-pay | Admitting: Family Medicine

## 2022-05-26 DIAGNOSIS — Z872 Personal history of diseases of the skin and subcutaneous tissue: Secondary | ICD-10-CM | POA: Diagnosis not present

## 2022-05-26 NOTE — Progress Notes (Signed)
[name] [address] [place] [address] Phone: [phone] Subjective:   [name], am serving as a scribe for Dr. Antoine Primas.  I'm seeing this patient by the request  of:  Patient, No Pcp Per  CC: Left wrist follow-up, back pain follow-up, new right elbow pain  [id number]  Jaclyn [name] is a 46 y.o. female coming in with complaint of back and neck pain. OMT on 04/21/2022. Had left wrist injection at last appointment. Patient states that she feels like she has a rib out. Also c/o pain in R elbow over medial epicondyle.   Medications patient has been prescribed: hydroxyzine  Taking:         Reviewed prior external information including notes and imaging from previsou exam, outside providers and external EMR if available.   As well as notes that were available from care everywhere and other healthcare systems.  Past medical history, social, surgical and family history all reviewed in electronic medical record.  No pertanent information unless stated regarding to the chief complaint.   Past Medical History:  Diagnosis Date   Allergy    Anxiety    Asthma    Chicken pox    GERD (gastroesophageal reflux disease)    IBS (irritable bowel syndrome)    Pneumonia 2015    No Known Allergies   Review of Systems:  No headache, visual changes, nausea, vomiting, diarrhea, constipation, dizziness, abdominal pain, skin rash, fevers, chills, night sweats, weight loss, swollen lymph nodes, body aches, joint swelling, chest pain, shortness of breath, mood changes. POSITIVE muscle aches  Objective  Blood pressure 98/62, pulse 70, height 5\' 6"  (1.676 m), weight 145 lb (65.8 kg), SpO2 98 %.   General: No apparent distress alert and oriented x3 mood and affect normal, dressed appropriately.  HEENT: Pupils equal, extraocular movements intact  Respiratory: Patient's speak in full sentences and does not appear short of breath   Cardiovascular: No lower extremity edema, non tender, no erythema  Gait gait is normal MSK: Patient's right elbow hurts more on the medial aspect.  Positive Tinel's sign.  Good grip strength.  Left wrist improved.  Negative Tinel's today. Back continue tightness mostly in the parascapular region.  Seems to be left greater than right.  Osteopathic findings  C2 flexed rotated and side bent right C6 flexed rotated and side bent left T3 extended rotated and side bent left inhaled rib T9 extended rotated and side bent left L2 flexed rotated and side bent right Sacrum right on right  Limited muscular skeletal ultrasound was performed and interpreted by , M  Limited ultrasound of patient's right elbow shows the patient does have dilatation of the ulnar nerve noted with some subluxation with it being more superficial to the medial epicondyle.  No cortical defect noted.  No hypoechoic changes or increasing in Doppler flow of the muscle itself. 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  Continue with conservative therapy.  Medial epicondylitis of elbow, right Right elbow.  Discussed proper movement.  I am more concerned that patient actually has more of an ulnar subluxation.  We will start with some nerve flossing techniques.  Discussed compression and avoiding hard surfaces.  Follow-up again in 6 weeks worsening pain can consider injection    Nonallopathic problems  Decision today to treat with OMT was based on Physical Exam  After verbal consent patient was treated with  HVLA, ME, FPR techniques in cervical, rib, thoracic, lumbar, and sacral  areas  Patient tolerated the procedure well with improvement in symptoms  Patient given exercises, stretches and lifestyle modifications  See medications in patient instructions if given  Patient will follow up in 4-8 weeks     The above  documentation has been reviewed and is accurate and complete [name], DO         Note: This dictation was prepared with Dragon dictation along with smaller phrase technology. Any transcriptional errors that result from this process are unintentional.

## 2022-05-27 ENCOUNTER — Ambulatory Visit: Payer: Self-pay

## 2022-05-27 ENCOUNTER — Ambulatory Visit: Payer: 59 | Admitting: Family Medicine

## 2022-05-27 VITALS — BP 98/62 | HR 70 | Ht 66.0 in | Wt 145.0 lb

## 2022-05-27 DIAGNOSIS — M9903 Segmental and somatic dysfunction of lumbar region: Secondary | ICD-10-CM | POA: Diagnosis not present

## 2022-05-27 DIAGNOSIS — M9904 Segmental and somatic dysfunction of sacral region: Secondary | ICD-10-CM | POA: Diagnosis not present

## 2022-05-27 DIAGNOSIS — M7701 Medial epicondylitis, right elbow: Secondary | ICD-10-CM

## 2022-05-27 DIAGNOSIS — M9902 Segmental and somatic dysfunction of thoracic region: Secondary | ICD-10-CM | POA: Diagnosis not present

## 2022-05-27 DIAGNOSIS — M25521 Pain in right elbow: Secondary | ICD-10-CM

## 2022-05-27 DIAGNOSIS — M9908 Segmental and somatic dysfunction of rib cage: Secondary | ICD-10-CM | POA: Diagnosis not present

## 2022-05-27 DIAGNOSIS — G5602 Carpal tunnel syndrome, left upper limb: Secondary | ICD-10-CM

## 2022-05-27 DIAGNOSIS — M9901 Segmental and somatic dysfunction of cervical region: Secondary | ICD-10-CM

## 2022-05-27 NOTE — Patient Instructions (Addendum)
Compression daily for 2 weeks Avoid setting arm on hard surfaces See me again in 6-8 weeks

## 2022-05-28 ENCOUNTER — Encounter: Payer: Self-pay | Admitting: Family Medicine

## 2022-05-28 DIAGNOSIS — M7701 Medial epicondylitis, right elbow: Secondary | ICD-10-CM | POA: Insufficient documentation

## 2022-05-28 NOTE — Assessment & Plan Note (Signed)
Significant improvement after injection.  Continue with conservative therapy.

## 2022-05-28 NOTE — Assessment & Plan Note (Signed)
Right elbow.  Discussed proper movement.  I am more concerned that patient actually has more of an ulnar subluxation.  We will start with some nerve flossing techniques.  Discussed compression and avoiding hard surfaces.  Follow-up again in 6 weeks worsening pain can consider injection

## 2022-06-09 ENCOUNTER — Encounter: Payer: Self-pay | Admitting: Family Medicine

## 2022-06-09 ENCOUNTER — Ambulatory Visit: Payer: 59 | Admitting: Family Medicine

## 2022-06-09 ENCOUNTER — Ambulatory Visit: Payer: Self-pay

## 2022-06-09 VITALS — BP 112/66 | HR 75 | Ht 66.0 in

## 2022-06-09 DIAGNOSIS — G5622 Lesion of ulnar nerve, left upper limb: Secondary | ICD-10-CM

## 2022-06-09 DIAGNOSIS — M25521 Pain in right elbow: Secondary | ICD-10-CM

## 2022-06-09 NOTE — Patient Instructions (Signed)
Looks like you had injury to nerve Should get better Continue with what you are doing Consider Graston technique Keep other appt

## 2022-06-09 NOTE — Progress Notes (Signed)
[name] [address] [place] [address] Phone: [phone] Subjective:   Jaclyn [name], am serving as a scribe for Dr. Antoine Primas.  I'm seeing this patient by the request  of:  Patient, No Pcp Per  CC: Right elbow pain  [id number]  Jaclyn [name] is a 46 y.o. female coming in with complaint of R elbow pain.  Patient states that she was doing better but pain has been radiating up and down the arm. Today pain is better. Has popping sensation with elbow extension.         Past Medical History:  Diagnosis Date   Allergy    Anxiety    Asthma    Chicken pox    GERD (gastroesophageal reflux disease)    IBS (irritable bowel syndrome)    Pneumonia 2015   Past Surgical History:  Procedure Laterality Date   FOOT SURGERY     Social History   Socioeconomic History   Marital status: Legally Separated    Spouse name: Not on file   Number of children: Not on file   Years of education: Not on file   Highest education level: Not on file  Occupational History   Not on file  Tobacco Use   Smoking status: Never   Smokeless tobacco: Never  Vaping Use   Vaping Use: Never used  Substance and Sexual Activity   Alcohol use: No    Alcohol/week: 0.0 standard drinks of alcohol   Drug use: No   Sexual activity: Not on file  Other Topics Concern   Not on file  Social History Narrative   Not on file   Social Determinants of Health   Financial Resource Strain: Not on file  Food Insecurity: Not on file  Transportation Needs: Not on file  Physical Activity: Not on file  Stress: Not on file  Social Connections: Not on file   No Known Allergies Family History  Problem Relation Age of Onset   Alcohol abuse Mother    Cancer Mother    Heart disease Mother    Multiple sclerosis Mother    Alcohol abuse Father    Heart disease Father    Bladder Cancer Father    Multiple sclerosis Brother    Lung cancer Paternal Grandmother     Esophageal cancer Paternal Grandmother    Colon cancer Neg Hx     Current Outpatient Medications (Endocrine & Metabolic):    predniSONE (DELTASONE) 20 MG tablet, Take 1 tablet (20 mg total) by mouth daily with breakfast.   predniSONE (DELTASONE) 20 MG tablet, Take 1 tablet (20 mg total) by mouth daily with breakfast.   predniSONE (DELTASONE) 20 MG tablet, Take 1 tablet (20 mg total) by mouth 2 (two) times daily.   Current Outpatient Medications (Respiratory):    fluticasone (FLONASE) 50 MCG/ACT nasal spray, Place 1 spray into both nostrils daily.  Current Outpatient Medications (Analgesics):    naproxen (NAPROSYN) 500 MG tablet, Take 1 tablet (500 mg total) by mouth 2 (two) times daily with a meal. (Patient taking differently: Take 500 mg by mouth as needed.)  Current Outpatient Medications (Hematological):    vitamin B-12 (CYANOCOBALAMIN) 250 MCG tablet, Take 250 mcg by mouth daily.  Current Outpatient Medications (Other):    diazepam (VALIUM) 5 MG tablet, One tab by mouth, 2 hours before procedure.   esomeprazole (NEXIUM) 40 MG capsule, Take 40 mg by mouth 2 (two) times daily.   hydrOXYzine (ATARAX) 10 MG tablet, Take  1 tablet (10 mg total) by mouth 3 (three) times daily as needed.   MAGNESIUM CITRATE PO*, Take by mouth daily. 3/4 tsp in 1 liter of water   tizanidine (ZANAFLEX) 2 MG capsule, Take 1 capsule (2 mg total) by mouth 3 (three) times daily as needed for muscle spasms.   Vitamin D, Ergocalciferol, (DRISDOL) 1.25 MG (50000 UNIT) CAPS capsule, Take 1 capsule (50,000 Units total) by mouth every 7 (seven) days.   Vitamin D, Ergocalciferol, (DRISDOL) 1.25 MG (50000 UNIT) CAPS capsule, Take 1 capsule (50,000 Units total) by mouth every 7 (seven) days. * These medications belong to multiple therapeutic classes and are listed under each applicable group.   Reviewed prior external information including notes and imaging from  primary care provider As well as notes that were  available from care everywhere and other healthcare systems.  Past medical history, social, surgical and family history all reviewed in electronic medical record.  No pertanent information unless stated regarding to the chief complaint.   Review of Systems:  No headache, visual changes, nausea, vomiting, diarrhea, constipation, dizziness, abdominal pain, skin rash, fevers, chills, night sweats, weight loss, swollen lymph nodes, body aches, joint swelling, chest pain, shortness of breath, mood changes. POSITIVE muscle aches  Objective  Blood pressure 112/66, pulse 75, height 5\' 6"  (1.676 m), SpO2 98 %.   General: No apparent distress alert and oriented x3 mood and affect normal, dressed appropriately.  HEENT: Pupils equal, extraocular movements intact  Respiratory: Patient's speak in full sentences and does not appear short of breath  Cardiovascular: No lower extremity edema, non tender, no erythema  Tender to palpation over the ulnar nerve noted on the right side.  Positive Tinel's noted.  Patient has good range of motion otherwise.   Limited muscular skeletal ultrasound was performed and interpreted by , M  Limited ultrasound of patient's right elbow does not show any true new injury.  No cortical defect of the medial condyle.  No increase in hypoechoic changes of the flexor tendon sheaths.  Patient though does still have some dilatation of the ulnar nerve and has surrounding hyperechoic changes noted. Impression: Entrapment of the ulnar nerve   Impression and Recommendations:     The above documentation has been reviewed and is accurate and complete Antoine Primas, DO

## 2022-07-07 NOTE — Progress Notes (Signed)
[name] [address] [place] [address] Phone: [phone] Subjective:   Jaclyn [name], am serving as a scribe for Dr. Antoine Primas.  I'm seeing this patient by the request  of:  Patient, No Pcp Per  CC: Neck and back pain follow-up  [id number]  Jaclyn [name] is a 46 y.o. female coming in with complaint of back and neck pain. OMT on 05/27/2022. Also seen for right elbow pain. Patient states was having significantly worsening of the elbow pain again.  Patient was found to have more ulnar neuropathy with ulnar subluxation occurring. Pain in R elbow continues but is having slow progress. Pain in flexors.   Medications patient has been prescribed:   Taking:         Reviewed prior external information including notes and imaging from previsou exam, outside providers and external EMR if available.   As well as notes that were available from care everywhere and other healthcare systems.  Past medical history, social, surgical and family history all reviewed in electronic medical record.  No pertanent information unless stated regarding to the chief complaint.   Past Medical History:  Diagnosis Date   Allergy    Anxiety    Asthma    Chicken pox    GERD (gastroesophageal reflux disease)    IBS (irritable bowel syndrome)    Pneumonia 2015    No Known Allergies   Review of Systems:  No headache, visual changes, nausea, vomiting, diarrhea, constipation, dizziness, abdominal pain, skin rash, fevers, chills, night sweats, weight loss, swollen lymph nodes, body aches, joint swelling, chest pain, shortness of breath, mood changes. POSITIVE muscle aches  Objective  Blood pressure 100/68, pulse 70, height 5\' 6"  (1.676 m), SpO2 99 %.   General: No apparent distress alert and oriented x3 mood and affect normal, dressed appropriately.  HEENT: Pupils equal, extraocular movements intact  Respiratory: Patient's speak in full sentences and  does not appear short of breath  Cardiovascular: No lower extremity edema, non tender, no erythema   MSK:  Back low back tightness noted.  Hypomobility noted but does have more tightness noted in the right parascapular area.  Right elbow still has tenderness to palpation over the medial condyle area and worsening pain with Tinel's   Osteopathic findings  C2 flexed rotated and side bent right C6 flexed rotated and side bent left T3 extended rotated and side bent right inhaled rib T7 extended rotated and side bent left L2 flexed rotated and side bent right Sacrum right on right       Assessment and Plan:  Low back pain Discussed HEP, discussed posture, patient does massage as well  RTC in 6 weeks.    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  Discussed icing regimen of exercises.  Increase activity slowly.  Follow-up again in 6 to 8 weeks    Nonallopathic problems  Decision today to treat with OMT was based on Physical Exam  After verbal consent patient was treated with HVLA, ME, FPR techniques in cervical, rib, thoracic, lumbar, and sacral  areas  Patient tolerated the procedure well with improvement in symptoms  Patient given exercises, stretches and lifestyle modifications  See medications in patient instructions if given  Patient will follow up in 4-8 weeks    The above documentation has been reviewed and is accurate and complete , DO          Note: This dictation  was prepared with Dragon dictation along with smaller phrase technology. Any transcriptional errors that result from this process are unintentional.

## 2022-07-08 ENCOUNTER — Ambulatory Visit: Payer: 59 | Admitting: Family Medicine

## 2022-07-08 VITALS — BP 100/68 | HR 70 | Ht 66.0 in

## 2022-07-08 DIAGNOSIS — M940 Chondrocostal junction syndrome [Tietze]: Secondary | ICD-10-CM | POA: Diagnosis not present

## 2022-07-08 DIAGNOSIS — M9901 Segmental and somatic dysfunction of cervical region: Secondary | ICD-10-CM | POA: Diagnosis not present

## 2022-07-08 DIAGNOSIS — G8929 Other chronic pain: Secondary | ICD-10-CM

## 2022-07-08 DIAGNOSIS — M9908 Segmental and somatic dysfunction of rib cage: Secondary | ICD-10-CM

## 2022-07-08 DIAGNOSIS — M9902 Segmental and somatic dysfunction of thoracic region: Secondary | ICD-10-CM | POA: Diagnosis not present

## 2022-07-08 DIAGNOSIS — M9904 Segmental and somatic dysfunction of sacral region: Secondary | ICD-10-CM | POA: Diagnosis not present

## 2022-07-08 DIAGNOSIS — M545 Low back pain, unspecified: Secondary | ICD-10-CM

## 2022-07-08 DIAGNOSIS — M9903 Segmental and somatic dysfunction of lumbar region: Secondary | ICD-10-CM | POA: Diagnosis not present

## 2022-07-08 DIAGNOSIS — G5622 Lesion of ulnar nerve, left upper limb: Secondary | ICD-10-CM

## 2022-07-08 NOTE — Assessment & Plan Note (Signed)
Slipped rib syndrome  Patient is compensating for the elbow.  Discussed icing regimen of exercises.  Increase activity slowly.  Follow-up again in 6 to 8 weeks

## 2022-07-08 NOTE — Assessment & Plan Note (Signed)
Discussed HEP, discussed posture, patient does massage as well  RTC in 6 weeks.

## 2022-07-08 NOTE — Patient Instructions (Signed)
Thanks for making the staff happy with the sugar rush

## 2022-07-08 NOTE — Assessment & Plan Note (Signed)
Discussed if worsening

## 2022-07-11 ENCOUNTER — Encounter: Payer: Self-pay | Admitting: Family Medicine

## 2022-07-13 ENCOUNTER — Encounter: Payer: Self-pay | Admitting: Family Medicine

## 2022-08-15 ENCOUNTER — Encounter: Payer: Self-pay | Admitting: Family Medicine

## 2022-08-16 ENCOUNTER — Ambulatory Visit: Payer: 59 | Admitting: Family Medicine

## 2022-08-16 VITALS — BP 114/72 | Wt 145.0 lb

## 2022-08-16 DIAGNOSIS — G8929 Other chronic pain: Secondary | ICD-10-CM

## 2022-08-16 DIAGNOSIS — M9902 Segmental and somatic dysfunction of thoracic region: Secondary | ICD-10-CM

## 2022-08-16 DIAGNOSIS — M9908 Segmental and somatic dysfunction of rib cage: Secondary | ICD-10-CM

## 2022-08-16 DIAGNOSIS — M545 Low back pain, unspecified: Secondary | ICD-10-CM | POA: Diagnosis not present

## 2022-08-16 DIAGNOSIS — M9901 Segmental and somatic dysfunction of cervical region: Secondary | ICD-10-CM

## 2022-08-16 DIAGNOSIS — M9904 Segmental and somatic dysfunction of sacral region: Secondary | ICD-10-CM | POA: Diagnosis not present

## 2022-08-16 DIAGNOSIS — M9903 Segmental and somatic dysfunction of lumbar region: Secondary | ICD-10-CM | POA: Diagnosis not present

## 2022-08-16 DIAGNOSIS — M2550 Pain in unspecified joint: Secondary | ICD-10-CM

## 2022-08-16 MED ORDER — KETOROLAC TROMETHAMINE 60 MG/2ML IM SOLN: 60.0000 mg | Freq: Once | INTRAMUSCULAR | Status: AC

## 2022-08-16 MED ORDER — METHYLPREDNISOLONE ACETATE 80 MG/ML IJ SUSP: 80.0000 mg | Freq: Once | INTRAMUSCULAR | Status: AC

## 2022-08-16 NOTE — Patient Instructions (Signed)
See me in 5-6 weeks 

## 2022-08-16 NOTE — Progress Notes (Signed)
Jaclyn Jaclyn Phone: [phone] Subjective:   Jaclyn Jaclyn, am serving as a scribe for Dr. Hulan [name].  I'm seeing this patient by the request  of:  Patient, Jaclyn Jaclyn  CC: Worsening back and neck pain  [id number]  Jaclyn Jaclyn is a 46 y.o. female coming in with complaint of back and neck pain. OMT 07/08/2022. Patient states that she feels like her ribs are out after cleaning tub.  Patient feels like this is potentially cause more of an aggravation.  Starting to affect daily activities and as well as her sleep.  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    Medications patient has been prescribed: Hyrroxizine  Taking:         Reviewed prior external information including notes and imaging from previsou exam, outside providers and external EMR if available.   As well as notes that were available from care everywhere and other healthcare systems.  Past medical history, social, surgical and family history all reviewed in electronic medical record.  Jaclyn pertanent information unless stated regarding to the chief complaint.   Past Medical History:  Diagnosis Date   Allergy    Anxiety    Asthma    Chicken pox    GERD (gastroesophageal reflux disease)    IBS (irritable bowel syndrome)    Pneumonia 2015    Jaclyn Known Allergies   Review of Systems:  Jaclyn headache, visual changes, nausea, vomiting, diarrhea, constipation, dizziness, abdominal pain, skin rash, fevers, chills, night sweats, weight loss, swollen lymph nodes, joint swelling, chest pain, shortness of breath, mood changes. POSITIVE muscle aches, body aches  Objective  Blood pressure 114/72, weight 145 lb (65.8 kg).   General: Jaclyn apparent distress alert and oriented x3 mood and affect normal, dressed appropriately.  HEENT: Pupils equal, extraocular movements intact   Respiratory: Patient's speak in full sentences and does not appear short of breath  Cardiovascular: Jaclyn lower extremity edema, non tender, Jaclyn erythema  Low back exam loss of lordosis, positive FABER  Voluntary guarding of multiple different areas.  Neck exam does have some limited sidebending bilaterally.  Osteopathic findings  C2 flexed rotated and side bent right C5 flexed rotated and side bent left T3 extended rotated and side bent right inhaled rib T8 extended rotated and side bent left L2 flexed rotated and side bent right Sacrum right on right     Assessment and Plan:  Low back pain Significant exacerbation at the moment.  Affecting daily activities.  Patient did have a Toradol and Depo-Medrol injection today secondary to the severity of pain patient was having.  In addition to this given a low-dose of prednisone to try to help with the exacerbation of the tightness.  We did attempt osteopathic manipulation after evaluation to try to help with pain as well.  Follow-up again in 6 to 8 weeks    Nonallopathic problems  Decision today to treat with OMT was based on Physical Exam  After verbal consent patient was treated with HVLA, ME, FPR techniques in cervical, rib, thoracic, lumbar, and sacral  areas  Patient tolerated the procedure well with improvement in symptoms  Patient given exercises, stretches and lifestyle modifications  See medications in patient instructions if given  Patient will follow up in 4-8 weeks     The above documentation has been reviewed and is accurate and complete Jaclyn Pulley,  DO         Note: This dictation was prepared with Dragon dictation along with smaller phrase technology. Any transcriptional errors that result from this process are unintentional.

## 2022-08-17 ENCOUNTER — Encounter: Payer: Self-pay | Admitting: Family Medicine

## 2022-08-17 NOTE — Assessment & Plan Note (Signed)
Significant exacerbation at the moment.  Affecting daily activities.  Patient did have a Toradol and Depo-Medrol injection today secondary to the severity of pain patient was having.  In addition to this given a low-dose of prednisone to try to help with the exacerbation of the tightness.  We did attempt osteopathic manipulation after evaluation to try to help with pain as well.  Follow-up again in 6 to 8 weeks

## 2022-08-18 ENCOUNTER — Encounter: Payer: Self-pay | Admitting: Family Medicine

## 2022-08-19 ENCOUNTER — Ambulatory Visit: Payer: 59 | Admitting: Family Medicine

## 2022-08-30 MED ORDER — NAPROXEN 500 MG PO TABS: 500.0000 mg | ORAL_TABLET | Freq: Two times a day (BID) | ORAL | 0 refills | Status: AC

## 2022-09-22 NOTE — Progress Notes (Signed)
[name] [address] [place] [address] Phone: [phone] Subjective:   [name], am serving as a scribe for Dr. Antoine Primas.  I'm seeing this patient by the request  of:  Patient, No Pcp Per  CC: Back and neck pain follow-up  [id number]  Jaclyn [name] is a 46 y.o. female coming in with complaint of back and neck pain. OMT 08/16/2022.  Patient states that her R elbow, L wrist and neck pain have increased in pain.   Medications patient has been prescribed: Naproxen  Taking:         Reviewed prior external information including notes and imaging from previsou exam, outside providers and external EMR if available.   As well as notes that were available from care everywhere and other healthcare systems.  Past medical history, social, surgical and family history all reviewed in electronic medical record.  No pertanent information unless stated regarding to the chief complaint.   Past Medical History:  Diagnosis Date   Allergy    Anxiety    Asthma    Chicken pox    GERD (gastroesophageal reflux disease)    IBS (irritable bowel syndrome)    Pneumonia 2015    No Known Allergies   Review of Systems:  No headache, visual changes, nausea, vomiting, diarrhea, constipation, dizziness, abdominal pain, skin rash, fevers, chills, night sweats, weight loss, swollen lymph nodes, body aches, joint swelling, chest pain, shortness of breath, mood changes. POSITIVE muscle aches  Objective  Blood pressure 92/64, pulse 78, height 5\' 6"  (1.676 m), SpO2 98 %.   General: No apparent distress alert and oriented x3 mood and affect normal, dressed appropriately.  HEENT: Pupils equal, extraocular movements intact  Respiratory: Patient's speak in full sentences and does not appear short of breath  Cardiovascular: No lower extremity edema, non tender, no erythema  Back exam does have significant tenderness in the right parascapular  region.  Does feel like slipped rib there again.  Pain out of proportion.  Patient does have significant tenderness over the medial epicondylar region again.  Worsening pain with flexion against resistance of the wrist and elbow.  Negative Tinel's today.  Wrist exam does show positive Tinel's at the wrist and moderately over the cubital tunnel.  Osteopathic findings  C2 flexed rotated and side bent right C6 flexed rotated and side bent left T3 extended rotated and side bent right inhaled rib T9 extended rotated and side bent left L2 flexed rotated and side bent right Sacrum right on right       Assessment and Plan:  Slipped rib syndrome Continues trouble overall. Discussed HEP, discussed posture with work. Increase strength.  RTC in 6 weeks   Medial epicondylitis of elbow, right Still difficult. Could consider injeciton if it continues  Discussed avoiding cell phone as much  Left carpal tunnel syndrome Improved but not perfect.  RTC in 6 weeks maybe repeat injection    Discussed medications including the Zanaflex 2 mg at night as needed.  Questionable use of anti-inflammatories over-the-counter would be helpful.  We have prescribed naproxen 500 mg to take twice a day as needed. Nonallopathic problems  Decision today to treat with OMT was based on Physical Exam  After verbal consent patient was treated with HVLA, ME, FPR techniques in cervical, rib, thoracic, lumbar, and sacral  areas  Patient tolerated the procedure well with improvement in symptoms  Patient given exercises, stretches and lifestyle modifications  See medications in patient instructions  if given  Patient will follow up in 4-8 weeks     The above documentation has been reviewed and is accurate and complete [name], DO         Note: This dictation was prepared with Dragon dictation along with smaller phrase technology. Any transcriptional errors that result from this process are  unintentional.

## 2022-09-23 ENCOUNTER — Ambulatory Visit: Payer: 59 | Admitting: Family Medicine

## 2022-09-23 ENCOUNTER — Encounter: Payer: Self-pay | Admitting: Family Medicine

## 2022-09-23 VITALS — BP 92/64 | HR 78 | Ht 66.0 in

## 2022-09-23 DIAGNOSIS — M9901 Segmental and somatic dysfunction of cervical region: Secondary | ICD-10-CM | POA: Diagnosis not present

## 2022-09-23 DIAGNOSIS — G5602 Carpal tunnel syndrome, left upper limb: Secondary | ICD-10-CM | POA: Diagnosis not present

## 2022-09-23 DIAGNOSIS — M9904 Segmental and somatic dysfunction of sacral region: Secondary | ICD-10-CM | POA: Diagnosis not present

## 2022-09-23 DIAGNOSIS — M7701 Medial epicondylitis, right elbow: Secondary | ICD-10-CM

## 2022-09-23 DIAGNOSIS — M940 Chondrocostal junction syndrome [Tietze]: Secondary | ICD-10-CM | POA: Diagnosis not present

## 2022-09-23 DIAGNOSIS — M9908 Segmental and somatic dysfunction of rib cage: Secondary | ICD-10-CM | POA: Diagnosis not present

## 2022-09-23 DIAGNOSIS — M9902 Segmental and somatic dysfunction of thoracic region: Secondary | ICD-10-CM

## 2022-09-23 DIAGNOSIS — M9903 Segmental and somatic dysfunction of lumbar region: Secondary | ICD-10-CM | POA: Diagnosis not present

## 2022-09-23 NOTE — Patient Instructions (Signed)
Find a stand for your phone  Figure out what you are buying yourself for Christmas See me in 6 weeks

## 2022-09-24 NOTE — Assessment & Plan Note (Signed)
Still difficult. Could consider injeciton if it continues  Discussed avoiding cell phone as much

## 2022-09-24 NOTE — Assessment & Plan Note (Signed)
Improved but not perfect.  RTC in 6 weeks maybe repeat injection

## 2022-09-24 NOTE — Assessment & Plan Note (Signed)
Continues trouble overall. Discussed HEP, discussed posture with work. Increase strength.  RTC in 6 weeks

## 2022-10-26 NOTE — Progress Notes (Signed)
  [place] Phone: [phone] Subjective:   Jaclyn [name], am serving as a scribe for Dr. Hulan Saas.  I'm seeing this patient by the request  of:  Patient, [name] Pcp Per  CC: Neck and back pain follow-up  [id number]  Jaclyn [name] is a 47 y.o. female coming in with complaint of back and neck pain. OMT 09/23/2022. Patient states that she is having pain in the ribs.  Continues to have some discomfort and pain.  Describes the pain as a dull, throbbing aching pain.  Medications patient has been prescribed: None  Taking:         Reviewed prior external information including notes and imaging from previsou exam, outside providers and external EMR if available.   As well as notes that were available from care everywhere and other healthcare systems.  Past medical history, social, surgical and family history all reviewed in electronic medical record.  [name] unless stated regarding to the chief complaint.   Past Medical History:  Diagnosis Date   Allergy    Anxiety    Asthma    Chicken pox    GERD (gastroesophageal reflux disease)    IBS (irritable bowel syndrome)    Pneumonia 2015    [name] Known Allergies   Review of Systems:  [name] headache, visual changes, nausea, vomiting, diarrhea, constipation, dizziness, abdominal pain, skin rash, fevers, chills, night sweats, weight loss, swollen lymph nodes, body aches, joint swelling, chest pain, shortness of breath, mood changes. POSITIVE muscle aches  Objective  Blood pressure 102/62, pulse 75, height 5\' 6"  (1.676 m), SpO2 96 %.   General: [name] apparent distress alert and oriented x3 mood and affect normal, dressed appropriately.  HEENT: Pupils equal, extraocular movements intact  Respiratory: Patient's speak in full sentences and does not appear short of breath  Cardiovascular: [name] lower extremity edema, non tender, [name] erythema   Gait MSK:  Back   Osteopathic findings  C2 flexed rotated and side bent right C6 flexed rotated and side bent left T3 extended rotated and side bent right inhaled rib T9 extended rotated and side bent right  L2 flexed rotated and side bent right Sacrum right on right       Assessment and Plan:  Low back pain Patient does have tightness noted more in the low back as well as in the thoracolumbar juncture.  Still responding extremely well to osteopathic manipulation.  Discussed posture and ergonomics.  Discussed topical anti-inflammatory still.  Patient continues to play with ergonomics which has made some improvement.  Follow-up again in 6 to 8 weeks    Nonallopathic problems  Decision today to treat with OMT was based on Physical Exam  After verbal consent patient was treated with HVLA, ME, FPR techniques in cervical, rib, thoracic, lumbar, and sacral  areas  Patient tolerated the procedure well with improvement in symptoms  Patient given exercises, stretches and lifestyle modifications  See medications in patient instructions if given  Patient will follow up in 4-8 weeks    The above documentation has been reviewed and is accurate and complete Lyndal Pulley, DO          Note: This dictation was prepared with Dragon dictation along with smaller phrase technology. Any transcriptional errors that result from this process are unintentional.

## 2022-10-28 ENCOUNTER — Ambulatory Visit: Payer: 59 | Admitting: Family Medicine

## 2022-10-28 VITALS — BP 102/62 | HR 75 | Ht 66.0 in

## 2022-10-28 DIAGNOSIS — M9902 Segmental and somatic dysfunction of thoracic region: Secondary | ICD-10-CM

## 2022-10-28 DIAGNOSIS — M9908 Segmental and somatic dysfunction of rib cage: Secondary | ICD-10-CM | POA: Diagnosis not present

## 2022-10-28 DIAGNOSIS — M545 Low back pain, unspecified: Secondary | ICD-10-CM | POA: Diagnosis not present

## 2022-10-28 DIAGNOSIS — M9903 Segmental and somatic dysfunction of lumbar region: Secondary | ICD-10-CM

## 2022-10-28 DIAGNOSIS — M9904 Segmental and somatic dysfunction of sacral region: Secondary | ICD-10-CM | POA: Diagnosis not present

## 2022-10-28 DIAGNOSIS — M9901 Segmental and somatic dysfunction of cervical region: Secondary | ICD-10-CM | POA: Diagnosis not present

## 2022-10-28 DIAGNOSIS — G8929 Other chronic pain: Secondary | ICD-10-CM

## 2022-10-28 NOTE — Patient Instructions (Signed)
See me again in 6 weeks 

## 2022-10-29 NOTE — Assessment & Plan Note (Signed)
Patient does have tightness noted more in the low back as well as in the thoracolumbar juncture.  Still responding extremely well to osteopathic manipulation.  Discussed posture and ergonomics.  Discussed topical anti-inflammatory still.  Patient continues to play with ergonomics which has made some improvement.  Follow-up again in 6 to 8 weeks

## 2022-11-07 NOTE — Progress Notes (Unsigned)
[name] [place] Phone: [phone] Subjective:   Jaclyn [name], am serving as a scribe for Dr. Hulan Saas.  I'm seeing this patient by the request  of:  Patient, No Pcp Per  CC: back and neck pain   [id number]  Jaclyn [name] is a 47 y.o. female coming in with complaint of back and neck pain Patient states that she sneezed last week and felt her rib pop out it hurts on both sides so she feels like it happened on the right and the left.  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          Reviewed prior external information including notes and imaging from previsou exam, outside providers and external EMR if available.   As well as notes that were available from care everywhere and other healthcare systems.  Past medical history, social, surgical and family history all reviewed in electronic medical record.  No pertanent information unless stated regarding to the chief complaint.   Past Medical History:  Diagnosis Date   Allergy    Anxiety    Asthma    Chicken pox    GERD (gastroesophageal reflux disease)    IBS (irritable bowel syndrome)    Pneumonia 2015    No Known Allergies   Review of Systems:  No headache, visual changes, nausea, vomiting, diarrhea, constipation, dizziness, abdominal pain, skin rash, fevers, chills, night sweats, weight loss, swollen lymph nodes, body aches, joint swelling, chest pain, shortness of breath, mood changes. POSITIVE muscle aches  Objective  Blood pressure 100/60, pulse 99, height 5\' 6"  (1.676 m), SpO2 (!) 74 %.   General: No apparent distress alert and oriented x3 mood and affect normal, dressed appropriately.  HEENT: Pupils equal, extraocular movements intact  Respiratory: Patient's speak in full sentences and does not appear short of breath   Cardiovascular: No lower extremity edema, non tender, no erythema  Back exam does have some loss lordosis. Tenderness to palpation in the paraspinal musculature.  Some limitation in the parascapular region right greater than left.  Patient does have some difficulty with deep breath.  Neck exam does have tightness noted bilaterally as well with difficulty with sidebending.  Osteopathic findings  C3 flexed rotated and side bent right C6 flexed rotated and side bent left T3 extended rotated and side bent right inhaled rib T7 extended rotated and side bent right with inhaled. 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  Toradol and Depo-Medrol given, attempted osteopathic manipulation as well.  Discussed with patient about which activities to do and which ones to avoid.  May take muscle relaxer, Zanaflex 4 mg at night.  Follow-up again in 3 to 8 weeks    Nonallopathic problems  Decision today to treat with OMT was based on Physical Exam  After verbal consent patient was treated with HVLA, ME, FPR techniques in cervical, rib, thoracic, lumbar, and sacral  areas  Patient tolerated the procedure well with improvement in symptoms  Patient given exercises, stretches and lifestyle modifications  See medications in patient instructions if given  Patient will follow up in 4-8 weeks    The above documentation has been reviewed  and is accurate and complete Lyndal Pulley, DO          Note: This dictation was prepared with Dragon dictation along with smaller phrase technology. Any transcriptional errors that result from this process are unintentional.

## 2022-11-08 ENCOUNTER — Ambulatory Visit: Payer: 59 | Admitting: Family Medicine

## 2022-11-08 ENCOUNTER — Encounter: Payer: Self-pay | Admitting: Family Medicine

## 2022-11-08 VITALS — BP 100/60 | HR 99 | Ht 66.0 in

## 2022-11-08 DIAGNOSIS — M9904 Segmental and somatic dysfunction of sacral region: Secondary | ICD-10-CM | POA: Diagnosis not present

## 2022-11-08 DIAGNOSIS — M9908 Segmental and somatic dysfunction of rib cage: Secondary | ICD-10-CM

## 2022-11-08 DIAGNOSIS — M9903 Segmental and somatic dysfunction of lumbar region: Secondary | ICD-10-CM | POA: Diagnosis not present

## 2022-11-08 DIAGNOSIS — M940 Chondrocostal junction syndrome [Tietze]: Secondary | ICD-10-CM | POA: Diagnosis not present

## 2022-11-08 DIAGNOSIS — M9901 Segmental and somatic dysfunction of cervical region: Secondary | ICD-10-CM | POA: Diagnosis not present

## 2022-11-08 DIAGNOSIS — M9902 Segmental and somatic dysfunction of thoracic region: Secondary | ICD-10-CM | POA: Diagnosis not present

## 2022-11-08 MED ORDER — METHYLPREDNISOLONE ACETATE 40 MG/ML IJ SUSP
40.0000 mg | Freq: Once | INTRAMUSCULAR | Status: AC
  Administered 2022-11-08: 40 mg via INTRAMUSCULAR

## 2022-11-08 MED ORDER — KETOROLAC TROMETHAMINE 60 MG/2ML IM SOLN
60.0000 mg | Freq: Once | INTRAMUSCULAR | Status: AC
  Administered 2022-11-08: 60 mg via INTRAMUSCULAR

## 2022-11-08 NOTE — Patient Instructions (Signed)
Good to see you  Injection in backside.

## 2022-11-08 NOTE — Assessment & Plan Note (Addendum)
Chronic problem with exacerbation.  Toradol and Depo-Medrol given, attempted osteopathic manipulation as well.  Discussed with patient about which activities to do and which ones to avoid.  May take muscle relaxer, Zanaflex 4 mg at night.  Follow-up again in 3 to 8 weeks

## 2022-11-10 ENCOUNTER — Other Ambulatory Visit: Payer: Self-pay

## 2022-11-10 MED ORDER — PREDNISONE 20 MG PO TABS: 40.0000 mg | ORAL_TABLET | Freq: Every day | ORAL | 0 refills | Status: DC

## 2022-11-16 MED ORDER — TIZANIDINE HCL 2 MG PO CAPS: 2.0000 mg | ORAL_CAPSULE | Freq: Three times a day (TID) | ORAL | 1 refills | Status: DC | PRN

## 2022-11-16 NOTE — Telephone Encounter (Signed)
Done! Hope you are feeling better!

## 2022-11-23 NOTE — Progress Notes (Unsigned)
[place] Phone: [phone] Subjective:   Jaclyn Jaclyn [name], am serving as a scribe for Dr. Hulan Saas.  I'm seeing this patient by the request  of:  Patient, Jaclyn Jaclyn [name]  CC: Back and neck pain follow-up  [id number]  Jaclyn Jaclyn [name] is a 47 y.o. female coming in with complaint of back and neck pain. OMT 11/08/2021. Patient states that she does not feel better since last visit. Last night her pain improved but feels like ribs 2, 3, and 7 were out. Patient was unable to raise arm overhead or out to her sides was painful.   Medications patient has been prescribed: Prednisone  Taking:      Patient previously has had an MRI of the thoracic spine without contrast showing mild degenerative disc disease with central disc protrusions from T6-T9   Reviewed prior external information including notes and imaging from previsou exam, outside providers and external EMR if available.   As well as notes that were available from care everywhere and other healthcare systems.  Past medical history, social, surgical and family history all reviewed in electronic medical record.  Jaclyn [name] unless stated regarding to the chief complaint.   Past Medical History:  Diagnosis Date   Allergy    Anxiety    Asthma    Chicken pox    GERD (gastroesophageal reflux disease)    IBS (irritable bowel syndrome)    Pneumonia 2015    Jaclyn [name] Known Allergies   Review of Systems:  Jaclyn [name] headache, visual changes, nausea, vomiting, diarrhea, constipation, dizziness, abdominal pain, skin rash, fevers, chills, night sweats, weight loss, swollen lymph nodes, body aches, joint swelling, chest pain, shortness of breath, mood changes. POSITIVE muscle aches  Objective  Blood pressure 108/72, pulse 75, height 5\' 6"  (1.676 m).   General: Jaclyn [name] apparent distress alert and oriented x3 mood and affect normal, dressed appropriately.  HEENT:  Pupils equal, extraocular movements intact  Respiratory: Patient's speak in full sentences and does not appear short of breath  Cardiovascular: Jaclyn [name] lower extremity edema, non tender, Jaclyn [name] erythema  Scapular dyskinesis noted on the right side.  Significant tightness and almost muscle spasm of the rhomboids, trapezius, and even levator scapula.  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  Still seems to be more chronic than anything else.  Discussed possibility of Toradol and Depo-Medrol but patient is hoping to be better.  Did discuss the muscle relaxer nightly for quite some time.  We did discuss anti-inflammatories 600 mg for short course to see if that would be helpful as well.  Follow-up with me again in 6 to 8 weeks for further evaluation and treatment.    Nonallopathic problems  Decision today to treat with OMT was based on Physical Exam  After verbal consent patient was treated with HVLA, ME, FPR techniques in cervical, rib, thoracic,  areas  Patient tolerated the procedure well with improvement in symptoms  Patient given exercises, stretches and lifestyle modifications  See medications in patient instructions if given  Patient will follow up in 4-8 weeks     The above documentation has been reviewed and is accurate and complete Jaclyn Pulley,  DO         Note: This dictation was prepared with Dragon dictation along with smaller phrase technology. Any transcriptional errors that result from this process are unintentional.

## 2022-11-25 ENCOUNTER — Ambulatory Visit: Payer: 59 | Admitting: Family Medicine

## 2022-11-25 VITALS — BP 108/72 | HR 75 | Ht 66.0 in

## 2022-11-25 DIAGNOSIS — M940 Chondrocostal junction syndrome [Tietze]: Secondary | ICD-10-CM | POA: Diagnosis not present

## 2022-11-25 DIAGNOSIS — M9901 Segmental and somatic dysfunction of cervical region: Secondary | ICD-10-CM

## 2022-11-25 DIAGNOSIS — M9902 Segmental and somatic dysfunction of thoracic region: Secondary | ICD-10-CM

## 2022-11-25 DIAGNOSIS — M9908 Segmental and somatic dysfunction of rib cage: Secondary | ICD-10-CM | POA: Diagnosis not present

## 2022-11-25 NOTE — Patient Instructions (Signed)
Great to see you as always YTA exercises See you again in 3-4 weeks Ibuprofen 3x a day for 3 days

## 2022-11-25 NOTE — Assessment & Plan Note (Signed)
Treated patient for a scapular dyskinesis.  Still seems to be more chronic than anything else.  Discussed possibility of Toradol and Depo-Medrol but patient is hoping to be better.  Did discuss the muscle relaxer nightly for quite some time.  We did discuss anti-inflammatories 600 mg for short course to see if that would be helpful as well.  Follow-up with me again in 6 to 8 weeks for further evaluation and treatment.

## 2022-12-01 NOTE — Progress Notes (Unsigned)
  [place] Phone: [phone] Subjective:   Fontaine No, am serving as a scribe for Dr. Hulan Saas.  I'm seeing this patient by the request  of:  Patient, No Pcp Per  CC: back and neck pain   [id number]  Jaclyn [name] is a 47 y.o. female coming in with complaint of back and neck pain. OMT on 11/25/2022. Patient states that she is 90% better. Able to work but she will have good and bad days with pain at her sternum.   Medications patient has been prescribed:   Taking:         Reviewed prior external information including notes and imaging from previsou exam, outside providers and external EMR if available.   As well as notes that were available from care everywhere and other healthcare systems.  Past medical history, social, surgical and family history all reviewed in electronic medical record.  No pertanent information unless stated regarding to the chief complaint.   Past Medical History:  Diagnosis Date   Allergy    Anxiety    Asthma    Chicken pox    GERD (gastroesophageal reflux disease)    IBS (irritable bowel syndrome)    Pneumonia 2015    No Known Allergies   Review of Systems:  No headache, visual changes, nausea, vomiting, diarrhea, constipation, dizziness, abdominal pain, skin rash, fevers, chills, night sweats, weight loss, swollen lymph nodes, body aches, joint swelling, chest pain, shortness of breath, mood changes. POSITIVE muscle aches  Objective  Blood pressure 110/64, pulse 85, height 5\' 6"  (1.676 m), SpO2 98 %.   General: No apparent distress alert and oriented x3 mood and affect normal, dressed appropriately.  HEENT: Pupils equal, extraocular movements intact  Respiratory: Patient's speak in full sentences and does not appear short of breath  Cardiovascular: No lower extremity edema, non tender, no erythema  Low back does have loss lordosis tightness FABER      Osteopathic findings  C6 flexed rotated and side bent right  T3 extended rotated and side bent right inhaled rib T9 extended rotated and side bent left L2 flexed rotated and side bent right Sacrum right on right     Assessment and Plan:  Slipped rib syndrome Continue which ones to avoid.  Increase activity slowly.  Follow-up in 6 to 8 weeks patient still will monitor for things such as costochondritis.  Discussed colchicine    Nonallopathic problems  Decision today to treat with OMT was based on Physical Exam  After verbal consent patient was treated with HVLA, ME, FPR techniques in cervical, rib, thoracic areas  Patient tolerated the procedure well with improvement in symptoms  Patient given exercises, stretches and lifestyle modifications  See medications in patient instructions if given  Patient will follow up in 6-8 weeks     The above documentation has been reviewed and is accurate and complete Lyndal Pulley, DO         Note: This dictation was prepared with Dragon dictation along with smaller phrase technology. Any transcriptional errors that result from this process are unintentional.

## 2022-12-02 ENCOUNTER — Ambulatory Visit: Payer: 59 | Admitting: Family Medicine

## 2022-12-02 ENCOUNTER — Encounter: Payer: Self-pay | Admitting: Family Medicine

## 2022-12-02 VITALS — BP 110/64 | HR 85 | Ht 66.0 in

## 2022-12-02 DIAGNOSIS — M9908 Segmental and somatic dysfunction of rib cage: Secondary | ICD-10-CM

## 2022-12-02 DIAGNOSIS — M9902 Segmental and somatic dysfunction of thoracic region: Secondary | ICD-10-CM

## 2022-12-02 DIAGNOSIS — M9904 Segmental and somatic dysfunction of sacral region: Secondary | ICD-10-CM

## 2022-12-02 DIAGNOSIS — M9903 Segmental and somatic dysfunction of lumbar region: Secondary | ICD-10-CM | POA: Diagnosis not present

## 2022-12-02 DIAGNOSIS — M9901 Segmental and somatic dysfunction of cervical region: Secondary | ICD-10-CM

## 2022-12-02 DIAGNOSIS — M940 Chondrocostal junction syndrome [Tietze]: Secondary | ICD-10-CM

## 2022-12-02 MED ORDER — COLCHICINE 0.6 MG PO TABS: 0.6000 mg | ORAL_TABLET | Freq: Two times a day (BID) | ORAL | 0 refills | Status: AC

## 2022-12-02 NOTE — Assessment & Plan Note (Signed)
Continue which ones to avoid.  Increase activity slowly.  Follow-up in 6 to 8 weeks patient still will monitor for things such as costochondritis.  Discussed colchicine

## 2022-12-02 NOTE — Patient Instructions (Signed)
Colchicine BID for 5 days Nexium daily for 2 weeks See me again in 5-6 weeks

## 2022-12-23 ENCOUNTER — Ambulatory Visit: Payer: 59 | Admitting: Family Medicine

## 2023-01-05 NOTE — Progress Notes (Signed)
[place] Phone: [phone] Subjective:   Jaclyn Jaclyn [name], am serving as a scribe for Dr. Hulan Saas.  I'm seeing this patient by the request  of:  Patient, Jaclyn [name] Pcp Per  CC: Upper back pain follow-up  [id number]  Jaclyn Jaclyn [name] is a 47 y.o. female coming in with complaint of back and neck pain. OMT on 12/02/2022. Patient states that 2 weeks ago she had a spasm 2 weeks.   Medications patient has been prescribed: colchicine zanaflex prednisone  Taking:         Reviewed prior external information including notes and imaging from previsou exam, outside providers and external EMR if available.   As well as notes that were available from care everywhere and other healthcare systems.  Past medical history, social, surgical and family history all reviewed in electronic medical record.  Jaclyn [name] unless stated regarding to the chief complaint.   Past Medical History:  Diagnosis Date   Allergy    Anxiety    Asthma    Chicken pox    GERD (gastroesophageal reflux disease)    IBS (irritable bowel syndrome)    Pneumonia 2015    Jaclyn [name] Known Allergies   Review of Systems:  Jaclyn [name] headache, visual changes, nausea, vomiting, diarrhea, constipation, dizziness, abdominal pain, skin rash, fevers, chills, night sweats, weight loss, swollen lymph nodes, body aches, joint swelling, chest pain, shortness of breath, mood changes. POSITIVE muscle aches  Objective  Blood pressure 98/62, pulse 68, height 5\' 6"  (1.676 m), SpO2 99 %.   General: Jaclyn [name] apparent distress alert and oriented x3 mood and affect normal, dressed appropriately.  HEENT: Pupils equal, extraocular movements intact  Respiratory: Patient's speak in full sentences and does not appear short of breath  Cardiovascular: Jaclyn [name] lower extremity edema, non tender, Jaclyn [name] erythema  MSK:  Upper back still has significant tightness noted in the parascapular  area right greater than left.  Patient does still have some limited sidebending bilaterally right greater than left.  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  Patient wants to make sure there is nothing that could be contributing to more muscle fatigue and will get some laboratory workup to make sure of this.  Patient will monitor for if any reflux or anything else could be potentially contributing to some of the pain but still seems to be more of a slipped rib syndrome.  Discussed with patient about icing regimen and home exercises.  Increase activity slowly.  Follow-up again in 6 to 8 weeks    Nonallopathic problems  Decision today to treat with OMT was based on Physical Exam  After verbal consent patient was treated with HVLA, ME, FPR techniques in cervical, rib, thoracic, lumbar, and sacral  areas  Patient tolerated the procedure well with improvement in symptoms  Patient given exercises, stretches and lifestyle modifications  See medications in patient instructions if given  Patient will follow up in 4-8 weeks    The above documentation has been reviewed and is accurate and complete Lyndal Pulley, DO          Note: This dictation was prepared with Dragon dictation  along with smaller phrase technology. Any transcriptional errors that result from this process are unintentional.

## 2023-01-06 ENCOUNTER — Ambulatory Visit: Payer: 59 | Admitting: Family Medicine

## 2023-01-06 VITALS — BP 98/62 | HR 68 | Ht 66.0 in

## 2023-01-06 DIAGNOSIS — M9903 Segmental and somatic dysfunction of lumbar region: Secondary | ICD-10-CM

## 2023-01-06 DIAGNOSIS — M9901 Segmental and somatic dysfunction of cervical region: Secondary | ICD-10-CM | POA: Diagnosis not present

## 2023-01-06 DIAGNOSIS — M940 Chondrocostal junction syndrome [Tietze]: Secondary | ICD-10-CM

## 2023-01-06 DIAGNOSIS — M2550 Pain in unspecified joint: Secondary | ICD-10-CM

## 2023-01-06 DIAGNOSIS — M9908 Segmental and somatic dysfunction of rib cage: Secondary | ICD-10-CM

## 2023-01-06 DIAGNOSIS — M9904 Segmental and somatic dysfunction of sacral region: Secondary | ICD-10-CM

## 2023-01-06 DIAGNOSIS — M9902 Segmental and somatic dysfunction of thoracic region: Secondary | ICD-10-CM

## 2023-01-06 LAB — IBC PANEL
Iron: 56 ug/dL (ref 42–145)
Saturation Ratios: 17.2 % — ABNORMAL LOW (ref 20.0–50.0)
TIBC: 324.8 ug/dL (ref 250.0–450.0)
Transferrin: 232 mg/dL (ref 212.0–360.0)

## 2023-01-06 LAB — TSH: TSH: 1.5 u[IU]/mL (ref 0.35–5.50)

## 2023-01-06 LAB — CBC WITH DIFFERENTIAL/PLATELET
Basophils Absolute: 0.1 10*3/uL (ref 0.0–0.1)
Basophils Relative: 1.4 % (ref 0.0–3.0)
Eosinophils Absolute: 0.1 10*3/uL (ref 0.0–0.7)
Eosinophils Relative: 1.3 % (ref 0.0–5.0)
HCT: 36.8 % (ref 36.0–46.0)
Hemoglobin: 12.4 g/dL (ref 12.0–15.0)
Lymphocytes Relative: 19.1 % (ref 12.0–46.0)
Lymphs Abs: 1.8 10*3/uL (ref 0.7–4.0)
MCHC: 33.6 g/dL (ref 30.0–36.0)
MCV: 90.8 fl (ref 78.0–100.0)
Monocytes Absolute: 0.8 10*3/uL (ref 0.1–1.0)
Monocytes Relative: 8.1 % (ref 3.0–12.0)
Neutro Abs: 6.8 10*3/uL (ref 1.4–7.7)
Neutrophils Relative %: 70.1 % (ref 43.0–77.0)
Platelets: 257 10*3/uL (ref 150.0–400.0)
RBC: 4.05 Mil/uL (ref 3.87–5.11)
RDW: 13.2 % (ref 11.5–15.5)
WBC: 9.6 10*3/uL (ref 4.0–10.5)

## 2023-01-06 LAB — VITAMIN D 25 HYDROXY (VIT D DEFICIENCY, FRACTURES): VITD: 54.76 ng/mL (ref 30.00–100.00)

## 2023-01-06 LAB — COMPREHENSIVE METABOLIC PANEL
ALT: 8 U/L (ref 0–35)
AST: 13 U/L (ref 0–37)
Albumin: 4 g/dL (ref 3.5–5.2)
Alkaline Phosphatase: 56 U/L (ref 39–117)
BUN: 13 mg/dL (ref 6–23)
CO2: 28 mEq/L (ref 19–32)
Calcium: 9.5 mg/dL (ref 8.4–10.5)
Chloride: 102 mEq/L (ref 96–112)
Creatinine, Ser: 0.93 mg/dL (ref 0.40–1.20)
GFR: 72.74 mL/min (ref >60.00)
Glucose, Bld: 138 mg/dL — ABNORMAL HIGH (ref 70–99)
Potassium: 4.3 mEq/L (ref 3.5–5.1)
Sodium: 137 mEq/L (ref 135–145)
Total Bilirubin: 0.3 mg/dL (ref 0.2–1.2)
Total Protein: 7 g/dL (ref 6.0–8.3)

## 2023-01-06 LAB — URIC ACID: Uric Acid, Serum: 4.8 mg/dL (ref 2.4–7.0)

## 2023-01-06 LAB — VITAMIN B12: Vitamin B-12: >1500 pg/mL — ABNORMAL HIGH (ref 211–911)

## 2023-01-06 LAB — FERRITIN: Ferritin: 26.6 ng/mL (ref 10.0–291.0)

## 2023-01-06 LAB — SEDIMENTATION RATE: Sed Rate: 12 mm/hr (ref 0–20)

## 2023-01-06 NOTE — Patient Instructions (Signed)
Labs today  See you again in 7-8 weeks

## 2023-01-07 ENCOUNTER — Encounter: Payer: Self-pay | Admitting: Family Medicine

## 2023-01-07 NOTE — Assessment & Plan Note (Signed)
Chronic problem with exacerbation again.  Patient wants to make sure there is nothing that could be contributing to more muscle fatigue and will get some laboratory workup to make sure of this.  Patient will monitor for if any reflux or anything else could be potentially contributing to some of the pain but still seems to be more of a slipped rib syndrome.  Discussed with patient about icing regimen and home exercises.  Increase activity slowly.  Follow-up again in 6 to 8 weeks

## 2023-01-08 LAB — PTH, INTACT AND CALCIUM
Calcium: 9.3 mg/dL (ref 8.6–10.2)
PTH: 38 pg/mL (ref 16–77)

## 2023-02-17 NOTE — Progress Notes (Signed)
  [name] [address] [place] [address] Phone: [phone] Subjective:   Jaclyn [name], am serving as a scribe for Dr. Antoine Primas.  I'm seeing this patient by the request  of:  Patient, No Pcp Per  CC: neck and low back pain follow up   [id number]  Jaclyn [name] is a 47 y.o. female coming in with complaint of back and neck pain. OMT on 01/06/2023. Patient states that her rib cage is a mess.  Patient continues to have chronic pain at this time.  Describes it as a dull, throbbing aching dissection discussed increasing activity          Reviewed prior external information including notes and imaging from previsou exam, outside providers and external EMR if available.   As well as notes that were available from care everywhere and other healthcare systems.  Past medical history, social, surgical and family history all reviewed in electronic medical record.  No pertanent information unless stated regarding to the chief complaint.   Past Medical History:  Diagnosis Date   Allergy    Anxiety    Asthma    Chicken pox    GERD (gastroesophageal reflux disease)    IBS (irritable bowel syndrome)    Pneumonia 2015    No Known Allergies   Review of Systems:  No headache, visual changes, nausea, vomiting, diarrhea, constipation, dizziness, abdominal pain, skin rash, fevers, chills, night sweats, weight loss, swollen lymph nodes, body aches, joint swelling, chest pain, shortness of breath, mood changes. POSITIVE muscle aches  Objective  Blood pressure 110/62, height 5\' 6"  (1.676 m).   General: No apparent distress alert and oriented x3 mood and affect normal, dressed appropriately.  HEENT: Pupils equal, extraocular movements intact  Respiratory: Patient's speak in full sentences and does not appear short of breath  Cardiovascular: No lower extremity edema, non tender, no erythema  MSK:  Back Exam shows patient does have  hypermobility still noted.  Does have some tenderness to palpation in the parascapular area right greater than left.  Osteopathic findings  C2 flexed rotated and side bent right C6 flexed rotated and side bent left T3 extended rotated and side bent right inhaled rib T9 extended rotated and side bent left inhaled rib L2 flexed rotated and side bent right Sacrum right on right     Assessment and Plan:  Slipped rib syndrome Continues to have some rib syndrome.  Discussed icing regimen and home exercises, which activities to do and which ones to avoid, increase activity slowly.  Still having significant difficulty.  Follow-up again in 6 to 8 weeks.    Nonallopathic problems  Decision today to treat with OMT was based on Physical Exam  After verbal consent patient was treated with HVLA, ME, FPR techniques in cervical, rib, thoracic, lumbar, and sacral  areas  Patient tolerated the procedure well with improvement in symptoms  Patient given exercises, stretches and lifestyle modifications  See medications in patient instructions if given  Patient will follow up in 4-8 weeks     The above documentation has been reviewed and is accurate and complete [name], DO         Note: This dictation was prepared with Dragon dictation along with smaller phrase technology. Any transcriptional errors that result from this process are unintentional.

## 2023-02-22 ENCOUNTER — Ambulatory Visit: Payer: 59 | Admitting: Family Medicine

## 2023-02-22 VITALS — BP 110/62 | Ht 66.0 in

## 2023-02-22 DIAGNOSIS — M9904 Segmental and somatic dysfunction of sacral region: Secondary | ICD-10-CM | POA: Diagnosis not present

## 2023-02-22 DIAGNOSIS — M9903 Segmental and somatic dysfunction of lumbar region: Secondary | ICD-10-CM | POA: Diagnosis not present

## 2023-02-22 DIAGNOSIS — M940 Chondrocostal junction syndrome [Tietze]: Secondary | ICD-10-CM | POA: Diagnosis not present

## 2023-02-22 DIAGNOSIS — M9901 Segmental and somatic dysfunction of cervical region: Secondary | ICD-10-CM

## 2023-02-22 DIAGNOSIS — M9908 Segmental and somatic dysfunction of rib cage: Secondary | ICD-10-CM

## 2023-02-22 DIAGNOSIS — M9902 Segmental and somatic dysfunction of thoracic region: Secondary | ICD-10-CM

## 2023-02-22 NOTE — Assessment & Plan Note (Signed)
Continues to have some rib syndrome.  Discussed icing regimen and home exercises, which activities to do and which ones to avoid, increase activity slowly.  Still having significant difficulty.  Follow-up again in 6 to 8 weeks.

## 2023-03-29 NOTE — Progress Notes (Unsigned)
[name] [address] [place] [address] Phone: [phone] Subjective:   INadine Counts, am serving as a scribe for Dr. Antoine Primas.  I'm seeing this patient by the request  of:  Patient, No Pcp Per  CC: Neck and back pain follow-up  [id number]  Carlinda Tape is a 47 y.o. female coming in with complaint of back and neck pain. OMT on 02/22/2023. Patient states doing okay. Same per usual, but a little better. No new concerns. Look at wrist.  Medications patient has been prescribed:   Taking:         Reviewed prior external information including notes and imaging from previsou exam, outside providers and external EMR if available.   As well as notes that were available from care everywhere and other healthcare systems.  Previous MRI images showed a small central disc protrusions at the T6-T9 area.  Past medical history, social, surgical and family history all reviewed in electronic medical record.  No pertanent information unless stated regarding to the chief complaint.   Past Medical History:  Diagnosis Date   Allergy    Anxiety    Asthma    Chicken pox    GERD (gastroesophageal reflux disease)    IBS (irritable bowel syndrome)    Pneumonia 2015       Review of Systems:  No headache, visual changes, nausea, vomiting, diarrhea, constipation, dizziness, abdominal pain, skin rash, fevers, chills, night sweats, weight loss, swollen lymph nodes, body aches, joint swelling, chest pain, shortness of breath, mood changes. POSITIVE muscle aches  Objective  Blood pressure 98/60, pulse 70, height 5\' 6"  (1.676 m), SpO2 97 %.   General: No apparent distress alert and oriented x3 mood and affect normal, dressed appropriately.  HEENT: Pupils equal, extraocular movements intact  Respiratory: Patient's speak in full sentences and does not appear short of breath  Cardiovascular: No lower extremity edema, non tender, no erythema   Hypermobility noted.  Continues to have tightness noted in the paraspinal musculature on the right side.  Patient does have what appears to be more tenderness over the parascapular area on the right side Osteopathic findings  C2 flexed rotated and side bent right C7 flexed rotated and side bent right T3 extended rotated and side bent right inhaled rib T9 extended rotated and side bent right inhaled rib L2 flexed rotated and side bent right Sacrum right on right       Assessment and Plan:  Slipped rib syndrome Sleep.  Syndrome noted.  Discussed which activities to do and which ones to avoid.  Continue to work on home exercises.  Does have a mild exacerbation.  Was given Toradol and Depo-Medrol today.  Follow-up again in 6 weeks    Nonallopathic problems  Decision today to treat with OMT was based on Physical Exam  After verbal consent patient was treated with HVLA, ME, FPR techniques in cervical, rib, thoracic, lumbar, and sacral  areas  Patient tolerated the procedure well with improvement in symptoms  Patient given exercises, stretches and lifestyle modifications  See medications in patient instructions if given  Patient will follow up in 4-8 weeks     The above documentation has been reviewed and is accurate and complete [name], DO         Note: This dictation was prepared with Dragon dictation along with smaller phrase technology. Any transcriptional errors that result from this process are unintentional.

## 2023-03-30 ENCOUNTER — Ambulatory Visit: Payer: 59 | Admitting: Family Medicine

## 2023-03-30 ENCOUNTER — Encounter: Payer: Self-pay | Admitting: Family Medicine

## 2023-03-30 VITALS — BP 98/60 | HR 70 | Ht 66.0 in

## 2023-03-30 DIAGNOSIS — M9904 Segmental and somatic dysfunction of sacral region: Secondary | ICD-10-CM | POA: Diagnosis not present

## 2023-03-30 DIAGNOSIS — M9902 Segmental and somatic dysfunction of thoracic region: Secondary | ICD-10-CM

## 2023-03-30 DIAGNOSIS — M9901 Segmental and somatic dysfunction of cervical region: Secondary | ICD-10-CM

## 2023-03-30 DIAGNOSIS — M940 Chondrocostal junction syndrome [Tietze]: Secondary | ICD-10-CM

## 2023-03-30 DIAGNOSIS — M9908 Segmental and somatic dysfunction of rib cage: Secondary | ICD-10-CM

## 2023-03-30 DIAGNOSIS — M9903 Segmental and somatic dysfunction of lumbar region: Secondary | ICD-10-CM

## 2023-03-30 MED ORDER — METHYLPREDNISOLONE ACETATE 40 MG/ML IJ SUSP
40.0000 mg | Freq: Once | INTRAMUSCULAR | Status: AC
Start: 2023-03-30 — End: 2023-03-30
  Administered 2023-03-30: 40 mg via INTRAMUSCULAR

## 2023-03-30 MED ORDER — KETOROLAC TROMETHAMINE 30 MG/ML IJ SOLN
30.0000 mg | Freq: Once | INTRAMUSCULAR | Status: AC
Start: 2023-03-30 — End: 2023-03-30
  Administered 2023-03-30: 30 mg via INTRAMUSCULAR

## 2023-03-30 NOTE — Assessment & Plan Note (Signed)
Sleep.  Syndrome noted.  Discussed which activities to do and which ones to avoid.  Continue to work on home exercises.  Does have a mild exacerbation.  Was given Toradol and Depo-Medrol today.  Follow-up again in 6 weeks

## 2023-03-30 NOTE — Patient Instructions (Signed)
See you again in 6-8 weeks 

## 2023-04-26 ENCOUNTER — Encounter: Payer: Self-pay | Admitting: Family Medicine

## 2023-04-26 NOTE — Progress Notes (Unsigned)
  [name] [address] [place] [address] Phone: [phone] Subjective:   Jaclyn [name], am serving as a scribe for Dr. Antoine Primas.  I'm seeing this patient by the request  of:  Patient, No Pcp Per  CC: Low back and neck pain follow-up  [id number]  Jaclyn [name] is a 47 y.o. female coming in with complaint of back and neck pain. OMT 03/30/2023. Patient states that ribs on L side are out in a few places.  Patient feels like it is somewhat severe.  Is stopping her from activity.  Given her pain at rest as well as with activity.  Medications patient has been prescribed: Zanaflex  Taking:         Reviewed prior external information including notes and imaging from previsou exam, outside providers and external EMR if available.   As well as notes that were available from care everywhere and other healthcare systems.  Past medical history, social, surgical and family history all reviewed in electronic medical record.  No pertanent information unless stated regarding to the chief complaint.   Past Medical History:  Diagnosis Date   Allergy    Anxiety    Asthma    Chicken pox    GERD (gastroesophageal reflux disease)    IBS (irritable bowel syndrome)    Pneumonia 2015    No Known Allergies   Review of Systems:  No headache, visual changes, nausea, vomiting, diarrhea, constipation, dizziness, abdominal pain, skin rash, fevers, chills, night sweats, weight loss, swollen lymph nodes, body aches, joint swelling, chest pain, shortness of breath, mood changes. POSITIVE muscle aches  Objective  Blood pressure 96/62, height 5\' 6"  (1.676 m).   General: No apparent distress alert and oriented x3 mood and affect normal, dressed appropriately.  HEENT: Pupils equal, extraocular movements intact  Respiratory: Patient's speak in full sentences and does not appear short of breath  Cardiovascular: No lower extremity edema, non tender, no  erythema  Increasing tightness noted in the scapular area.  Multiple slipped ribs fell through and again at this time.   Osteopathic findings  C3 flexed rotated and side bent right C5 flexed rotated and side bent left T3 extended rotated and side bent right inhaled rib T5 extended rotated and side bent left with inhaled rib T9 extended rotated and side bent left inhaled rib L1 flexed rotated and side bent right Sacrum right on right       Assessment and Plan:  Slipped rib syndrome Slipped rib syndrome noted.  Discussed posture and ergonomics.  Discussed which activities to do and which ones to avoid.  Increase activity slowly otherwise.  Patient will continue to work on stability exercises.    Nonallopathic problems  Decision today to treat with OMT was based on Physical Exam  After verbal consent patient was treated with HVLA, ME, FPR techniques in cervical, rib, thoracic, lumbar, and sacral  areas  Patient tolerated the procedure well with improvement in symptoms  Patient given exercises, stretches and lifestyle modifications  See medications in patient instructions if given  Patient will follow up in 4-8 weeks     The above documentation has been reviewed and is accurate and complete [name], DO         Note: This dictation was prepared with Dragon dictation along with smaller phrase technology. Any transcriptional errors that result from this process are unintentional.

## 2023-04-27 ENCOUNTER — Encounter: Payer: Self-pay | Admitting: Family Medicine

## 2023-04-27 ENCOUNTER — Ambulatory Visit: Payer: 59 | Admitting: Family Medicine

## 2023-04-27 VITALS — BP 96/62 | Ht 66.0 in

## 2023-04-27 DIAGNOSIS — M9903 Segmental and somatic dysfunction of lumbar region: Secondary | ICD-10-CM

## 2023-04-27 DIAGNOSIS — M9904 Segmental and somatic dysfunction of sacral region: Secondary | ICD-10-CM | POA: Diagnosis not present

## 2023-04-27 DIAGNOSIS — M940 Chondrocostal junction syndrome [Tietze]: Secondary | ICD-10-CM | POA: Diagnosis not present

## 2023-04-27 DIAGNOSIS — M9901 Segmental and somatic dysfunction of cervical region: Secondary | ICD-10-CM

## 2023-04-27 DIAGNOSIS — M9908 Segmental and somatic dysfunction of rib cage: Secondary | ICD-10-CM

## 2023-04-27 DIAGNOSIS — M9902 Segmental and somatic dysfunction of thoracic region: Secondary | ICD-10-CM

## 2023-04-27 NOTE — Assessment & Plan Note (Signed)
Slipped rib syndrome noted.  Discussed posture and ergonomics.  Discussed which activities to do and which ones to avoid.  Increase activity slowly otherwise.  Patient will continue to work on stability exercises.

## 2023-05-04 NOTE — Progress Notes (Unsigned)
  [name] [address] [place] [address] Phone: [phone] Subjective:   INadine Counts, am serving as a scribe for Dr. Antoine Primas.  I'm seeing this patient by the request  of:  Patient, No Pcp Per  CC: Back and neck pain follow-up  [id number]  Jaclyn [name] is a 47 y.o. female coming in with complaint of back and neck pain. OMT on 04/27/2023.  Patient had an excessive sliver of the at last exam.  Patient states patient states   Medications patient has been prescribed:   Taking:         Reviewed prior external information including notes and imaging from previsou exam, outside providers and external EMR if available.   As well as notes that were available from care everywhere and other healthcare systems.  Past medical history, social, surgical and family history all reviewed in electronic medical record.  No pertanent information unless stated regarding to the chief complaint.   Past Medical History:  Diagnosis Date   Allergy    Anxiety    Asthma    Chicken pox    GERD (gastroesophageal reflux disease)    IBS (irritable bowel syndrome)    Pneumonia 2015    No Known Allergies    Objective  Blood pressure 116/66, height 5\' 6"  (1.676 m).   General: No apparent distress alert and oriented x3 mood and affect normal, dressed appropriately.  HEENT: Pupils equal, extraocular movements intact  Respiratory: Patient's speak in full sentences and does not appear short of breath  Cardiovascular: No lower extremity edema, non tender, no erythema  Hypermobility noted.  No tenderness to palpation in the paraspinal musculature still noted.  Seems to be more on the right parascapular area than the left.  Osteopathic findings  C2 flexed rotated and side bent right C6 flexed rotated and side bent left T3 extended rotated and side bent right inhaled rib T9 extended rotated and side bent right with inhaled rib L2 flexed rotated  and side bent right Sacrum right on right    Assessment and Plan:  Slipped rib syndrome Improvement from previous exam.  Did discuss with patient about home exercises and icing regimen.  Discussed which activities to do and which ones to avoid.  Increase activity slowly.  Follow-up again with me in 6 weeks no the patient is getting closer to her baseline again.    Nonallopathic problems  Decision today to treat with OMT was based on Physical Exam  After verbal consent patient was treated with HVLA, ME, FPR techniques in cervical, rib, thoracic, lumbar, and sacral  areas  Patient tolerated the procedure well with improvement in symptoms  Patient given exercises, stretches and lifestyle modifications  See medications in patient instructions if given  Patient will follow up in 4-8 weeks    The above documentation has been reviewed and is accurate and complete [name], DO          Note: This dictation was prepared with Dragon dictation along with smaller phrase technology. Any transcriptional errors that result from this process are unintentional.

## 2023-05-05 ENCOUNTER — Ambulatory Visit: Payer: 59 | Admitting: Family Medicine

## 2023-05-05 ENCOUNTER — Encounter: Payer: Self-pay | Admitting: Family Medicine

## 2023-05-05 VITALS — BP 116/66 | Ht 66.0 in

## 2023-05-05 DIAGNOSIS — M9904 Segmental and somatic dysfunction of sacral region: Secondary | ICD-10-CM | POA: Diagnosis not present

## 2023-05-05 DIAGNOSIS — M9901 Segmental and somatic dysfunction of cervical region: Secondary | ICD-10-CM | POA: Diagnosis not present

## 2023-05-05 DIAGNOSIS — M940 Chondrocostal junction syndrome [Tietze]: Secondary | ICD-10-CM | POA: Diagnosis not present

## 2023-05-05 DIAGNOSIS — M9908 Segmental and somatic dysfunction of rib cage: Secondary | ICD-10-CM

## 2023-05-05 DIAGNOSIS — M9902 Segmental and somatic dysfunction of thoracic region: Secondary | ICD-10-CM

## 2023-05-05 DIAGNOSIS — M9903 Segmental and somatic dysfunction of lumbar region: Secondary | ICD-10-CM | POA: Diagnosis not present

## 2023-05-05 NOTE — Patient Instructions (Signed)
Good to see you! See you again in 6-8 weeks 

## 2023-05-05 NOTE — Assessment & Plan Note (Signed)
Improvement from previous exam.  Did discuss with patient about home exercises and icing regimen.  Discussed which activities to do and which ones to avoid.  Increase activity slowly.  Follow-up again with me in 6 weeks no the patient is getting closer to her baseline again.

## 2023-05-13 ENCOUNTER — Encounter: Payer: Self-pay | Admitting: Family Medicine

## 2023-06-07 NOTE — Progress Notes (Unsigned)
[name] [address] [place] [address] Phone: [phone] Subjective:   [name], am serving as a scribe for Dr. Antoine Primas.  I'm seeing this patient by the request  of:  Patient, No Pcp Per  CC: Low back pain follow-up  [id number]  Jaclyn [name] is a 47 y.o. female coming in with complaint of back and neck pain. OMT on 05/05/2023. Patient states that she has been doing a lot of core. Pain continued in ribs on R side more than L but can be on L side.   Medications patient has been prescribed:   Taking:         Reviewed prior external information including notes and imaging from previsou exam, outside providers and external EMR if available.   As well as notes that were available from care everywhere and other healthcare systems.  Past medical history, social, surgical and family history all reviewed in electronic medical record.  No pertanent information unless stated regarding to the chief complaint.   Past Medical History:  Diagnosis Date   Allergy    Anxiety    Asthma    Chicken pox    GERD (gastroesophageal reflux disease)    IBS (irritable bowel syndrome)    Pneumonia 2015    No Known Allergies   Review of Systems:  No headache, visual changes, nausea, vomiting, diarrhea, constipation, dizziness, abdominal pain, skin rash, fevers, chills, night sweats, weight loss, swollen lymph nodes,  joint swelling, chest pain, shortness of breath,. POSITIVE muscle aches, mood changes, body aches  Objective  Blood pressure 108/70, pulse 70, height 5\' 6"  (1.676 m), SpO2 99%.   General: No apparent distress alert and oriented x3 mood and affect normal, dressed appropriately.  HEENT: Pupils equal, extraocular movements intact  Respiratory: Patient's speak in full sentences and does not appear short of breath  Cardiovascular: No lower extremity edema, non tender, no erythema  Gait relatively normal MSK:  Back does  have some loss of lordosis noted.  Some tenderness to palpation in the paraspinal musculature.  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  Started on Cymbalta 20 mg.  Warned of potential side effects.  Has had a lot of stress in her life as well that is likely contributing to some of it.  Discussed with patient about icing regimen as well.  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  Discussed that this continues to give her difficulty do need to consider the possibility of some of the underlying persistent asthma and can try Qvar.  If this does not make any significant improvement as well I do think a CT scan of the chest would be beneficial.    Nonallopathic problems  Decision today to treat with OMT was based on Physical Exam  After verbal consent patient was treated with HVLA, ME, FPR techniques in cervical, rib, thoracic, lumbar, and sacral  areas  Patient tolerated the procedure well with improvement in symptoms  Patient given exercises, stretches and lifestyle modifications  See medications in patient instructions if given  Patient will follow up in 4-8 weeks    The  above documentation has been reviewed and is accurate and complete [name], DO          Note: This dictation was prepared with Dragon dictation along with smaller phrase technology. Any transcriptional errors that result from this process are unintentional.

## 2023-06-08 ENCOUNTER — Ambulatory Visit: Payer: 59 | Admitting: Family Medicine

## 2023-06-08 ENCOUNTER — Encounter: Payer: Self-pay | Admitting: Family Medicine

## 2023-06-08 VITALS — BP 108/70 | HR 70 | Ht 66.0 in

## 2023-06-08 DIAGNOSIS — M9903 Segmental and somatic dysfunction of lumbar region: Secondary | ICD-10-CM | POA: Diagnosis not present

## 2023-06-08 DIAGNOSIS — M940 Chondrocostal junction syndrome [Tietze]: Secondary | ICD-10-CM | POA: Diagnosis not present

## 2023-06-08 DIAGNOSIS — M9904 Segmental and somatic dysfunction of sacral region: Secondary | ICD-10-CM | POA: Diagnosis not present

## 2023-06-08 DIAGNOSIS — M9901 Segmental and somatic dysfunction of cervical region: Secondary | ICD-10-CM | POA: Diagnosis not present

## 2023-06-08 DIAGNOSIS — M9902 Segmental and somatic dysfunction of thoracic region: Secondary | ICD-10-CM

## 2023-06-08 DIAGNOSIS — M9908 Segmental and somatic dysfunction of rib cage: Secondary | ICD-10-CM | POA: Diagnosis not present

## 2023-06-08 MED ORDER — DULOXETINE HCL 20 MG PO CPEP: 20.0000 mg | ORAL_CAPSULE | Freq: Every day | ORAL | 0 refills | Status: AC

## 2023-06-08 NOTE — Assessment & Plan Note (Signed)
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 Her more of a chronic pain.  Started on Cymbalta 20 mg.  Warned of potential side effects.  Has had a lot of stress in her life as well that is likely contributing to some of it.  Discussed with patient about icing regimen as well.  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  Discussed that this continues to give her difficulty do need to consider the possibility of some of the underlying persistent asthma and can try Qvar.  If this does not make any significant improvement as well I do think a CT scan of the chest would be beneficial.

## 2023-06-08 NOTE — Patient Instructions (Signed)
Cymbalta 20mg   Give update in 2 weeks If not better will consider QVAR See me in 6 weeks

## 2023-06-30 ENCOUNTER — Encounter: Payer: Self-pay | Admitting: Family Medicine

## 2023-06-30 ENCOUNTER — Other Ambulatory Visit: Payer: Self-pay

## 2023-06-30 MED ORDER — QVAR REDIHALER 80 MCG/ACT IN AERB: 1.0000 | INHALATION_SPRAY | Freq: Two times a day (BID) | RESPIRATORY_TRACT | 1 refills | Status: AC

## 2023-06-30 MED ORDER — TIZANIDINE HCL 2 MG PO CAPS: 2.0000 mg | ORAL_CAPSULE | Freq: Every day | ORAL | 1 refills | Status: DC

## 2023-07-13 NOTE — Progress Notes (Unsigned)
[name] [address] [place] [address] Phone: [phone] Subjective:   Jaclyn [name], am serving as a scribe for Dr. Antoine Primas.  I'm seeing this patient by the request  of:  Patient, No Pcp Per  CC: Back and neck pain follow-up  [id number]  Jaclyn [name] is a 47 y.o. female coming in with complaint of back and neck pain. OMT on 06/08/2023. Patient states same per usual. No new concerns.  Medications patient has been prescribed: Qvar Inhaler zanaflex cymbalta  Taking:         Reviewed prior external information including notes and imaging from previsou exam, outside providers and external EMR if available.   As well as notes that were available from care everywhere and other healthcare systems.  Past medical history, social, surgical and family history all reviewed in electronic medical record.  No pertanent information unless stated regarding to the chief complaint.   Past Medical History:  Diagnosis Date   Allergy    Anxiety    Asthma    Chicken pox    GERD (gastroesophageal reflux disease)    IBS (irritable bowel syndrome)    Pneumonia 2015    No Known Allergies   Review of Systems:  No headache, visual changes, nausea, vomiting, diarrhea, constipation, dizziness, abdominal pain, skin rash, fevers, chills, night sweats, weight loss, swollen lymph nodes, body aches, joint swelling, chest pain, shortness of breath, mood changes. POSITIVE muscle aches  Objective  Blood pressure 110/72, pulse 63, height 5\' 6"  (1.676 m), SpO2 98%.   General: No apparent distress alert and oriented x3 mood and affect normal, dressed appropriately.  HEENT: Pupils equal, extraocular movements intact  Respiratory: Patient's speak in full sentences and does not appear short of breath  Cardiovascular: No lower extremity edema, non tender, no erythema  Gait MSK:  Back continues tightness in the parascapular area.  Seems to be right  greater than left.  Does have some tightness in the low back as well.  Right greater than left as well.  Negative straight leg test.  5 out of 5 strength of all the extremities.  Osteopathic findings  C2 flexed rotated and side bent right C5 flexed rotated and side bent left T3 extended rotated and side bent right inhaled rib T7 extended rotated and side bent left L2 flexed rotated and side bent right Sacrum right on right     Assessment and Plan:  Slipped rib syndrome Continued supportive syndrome.  Patient was noncompliant with the Cymbalta but will be may be starting it again at another time.  Discussed icing regimen and home exercises, discussed which activities to do and which ones to avoid.  Increase activity slowly.  Follow-up with me again in 6 to 8 weeks    Nonallopathic problems  Decision today to treat with OMT was based on Physical Exam  After verbal consent patient was treated with HVLA, ME, FPR techniques in cervical, rib, thoracic, lumbar, and sacral  areas  Patient tolerated the procedure well with improvement in symptoms  Patient given exercises, stretches and lifestyle modifications  See medications in patient instructions if given  Patient will follow up in 4-8 weeks    The above documentation has been reviewed and is accurate and complete [name], DO          Note: This dictation was prepared with Dragon dictation along with smaller phrase technology. Any transcriptional errors that result from this process are unintentional.

## 2023-07-14 ENCOUNTER — Ambulatory Visit: Payer: 59 | Admitting: Family Medicine

## 2023-07-14 ENCOUNTER — Encounter: Payer: Self-pay | Admitting: Family Medicine

## 2023-07-14 VITALS — BP 110/72 | HR 63 | Ht 66.0 in

## 2023-07-14 DIAGNOSIS — M9902 Segmental and somatic dysfunction of thoracic region: Secondary | ICD-10-CM

## 2023-07-14 DIAGNOSIS — M6289 Other specified disorders of muscle: Secondary | ICD-10-CM

## 2023-07-14 DIAGNOSIS — M9901 Segmental and somatic dysfunction of cervical region: Secondary | ICD-10-CM

## 2023-07-14 DIAGNOSIS — M9903 Segmental and somatic dysfunction of lumbar region: Secondary | ICD-10-CM

## 2023-07-14 DIAGNOSIS — M9904 Segmental and somatic dysfunction of sacral region: Secondary | ICD-10-CM

## 2023-07-14 DIAGNOSIS — M940 Chondrocostal junction syndrome [Tietze]: Secondary | ICD-10-CM | POA: Diagnosis not present

## 2023-07-14 DIAGNOSIS — M9908 Segmental and somatic dysfunction of rib cage: Secondary | ICD-10-CM | POA: Diagnosis not present

## 2023-07-14 NOTE — Assessment & Plan Note (Signed)
Continued supportive syndrome.  Patient was noncompliant with the Cymbalta but will be may be starting it again at another time.  Discussed icing regimen and home exercises, discussed which activities to do and which ones to avoid.  Increase activity slowly.  Follow-up with me again in 6 to 8 weeks

## 2023-07-14 NOTE — Patient Instructions (Addendum)
PT for Pelvic Floor See you again in 6 weeks

## 2023-08-03 ENCOUNTER — Encounter: Payer: Self-pay | Admitting: Family Medicine

## 2023-08-17 NOTE — Progress Notes (Unsigned)
  [name] [address] [place] [address] Phone: [phone] Subjective:   Jaclyn [name], am serving as a scribe for Dr. Antoine [name].  I'm seeing this patient by the request  of:  Patient, No Pcp Per  CC: Back and neck pain follow-up  [id number]  Jaclyn [name] is a 47 y.o. female coming in with complaint of back and neck pain. OMT on 07/14/2023. Patient states that she has pain in midfoot for past week over the top of foot when she leans forward over clients at work. No history of injury.   Went out of town and the hotel bed increased her back pain.   Medications patient has been prescribed: Qvar Redihaler, Zanaflex  Taking:         Reviewed prior external information including notes and imaging from previsou exam, outside providers and external EMR if available.   As well as notes that were available from care everywhere and other healthcare systems.  Past medical history, social, surgical and family history all reviewed in electronic medical record.  No pertanent information unless stated regarding to the chief complaint.   Past Medical History:  Diagnosis Date   Allergy    Anxiety    Asthma    Chicken pox    GERD (gastroesophageal reflux disease)    IBS (irritable bowel syndrome)    Pneumonia 2015    No Known Allergies   Review of Systems:  No headache, visual changes, nausea, vomiting, diarrhea, constipation, dizziness, abdominal pain, skin rash, fevers, chills, night sweats, weight loss, swollen lymph nodes, body aches, joint swelling, chest pain, shortness of breath, mood changes. POSITIVE muscle aches  Objective  Blood pressure 102/64, pulse 64, height 5\' 6"  (1.676 m), SpO2 97%.   General: No apparent distress alert and oriented x3 mood and affect normal, dressed appropriately.  HEENT: Pupils equal, extraocular movements intact  Respiratory: Patient's speak in full sentences and does not appear short of  breath  Cardiovascular: No lower extremity edema, non tender, no erythema  Back exam does have some loss lordosis noted.  Some tenderness to palpation in the paraspinal musculature.  Osteopathic findings  C2 flexed rotated and side bent right C6 flexed rotated and side bent left T3 extended rotated and side bent right inhaled rib T9 extended rotated and side bent left L2 flexed rotated and side bent right Sacrum right on right   Rigid midfoot noted    Assessment and Plan:  Slipped rib syndrome Continues to have some difficulty.  Discussed with patient continuing to work on the postural training, and the home exercises, which activities to do and which ones to avoid.  Increase activity slowly over the course of the next several weeks.    Nonallopathic problems  Decision today to treat with OMT was based on Physical Exam  After verbal consent patient was treated with HVLA, ME, FPR techniques in cervical, rib, thoracic, lumbar, and sacral  areas  Patient tolerated the procedure well with improvement in symptoms  Patient given exercises, stretches and lifestyle modifications  See medications in patient instructions if given  Patient will follow up in 4-8 weeks     The above documentation has been reviewed and is accurate and complete Jaclyn Saa, DO         Note: This dictation was prepared with Dragon dictation along with smaller phrase technology. Any transcriptional errors that result from this process are unintentional.

## 2023-08-18 ENCOUNTER — Encounter: Payer: Self-pay | Admitting: Family Medicine

## 2023-08-18 ENCOUNTER — Ambulatory Visit: Payer: 59 | Admitting: Family Medicine

## 2023-08-18 VITALS — BP 102/64 | HR 64 | Ht 66.0 in

## 2023-08-18 DIAGNOSIS — M79672 Pain in left foot: Secondary | ICD-10-CM | POA: Diagnosis not present

## 2023-08-18 DIAGNOSIS — M940 Chondrocostal junction syndrome [Tietze]: Secondary | ICD-10-CM | POA: Diagnosis not present

## 2023-08-18 DIAGNOSIS — M9903 Segmental and somatic dysfunction of lumbar region: Secondary | ICD-10-CM

## 2023-08-18 DIAGNOSIS — M9904 Segmental and somatic dysfunction of sacral region: Secondary | ICD-10-CM

## 2023-08-18 DIAGNOSIS — M9908 Segmental and somatic dysfunction of rib cage: Secondary | ICD-10-CM | POA: Diagnosis not present

## 2023-08-18 DIAGNOSIS — M9901 Segmental and somatic dysfunction of cervical region: Secondary | ICD-10-CM | POA: Diagnosis not present

## 2023-08-18 DIAGNOSIS — M9902 Segmental and somatic dysfunction of thoracic region: Secondary | ICD-10-CM

## 2023-08-18 MED ORDER — VITAMIN D (ERGOCALCIFEROL) 1.25 MG (50000 UNIT) PO CAPS: 50000.0000 [IU] | ORAL_CAPSULE | ORAL | 0 refills | Status: AC

## 2023-08-18 NOTE — Assessment & Plan Note (Signed)
Continues to have some difficulty.  Discussed with patient continuing to work on the postural training, and the home exercises, which activities to do and which ones to avoid.  Increase activity slowly over the course of the next several weeks.

## 2023-08-18 NOTE — Assessment & Plan Note (Signed)
Foot exam does show some tenderness to palpation over the midfoot.  Discussed with patient that it could be a potential for stress reaction based on patient's distribution of the pain.  Once weekly vitamin D, discussed potential more rigid soled shoes.  Do expected to get better relatively quickly.  Follow-up again in 6 weeks otherwise

## 2023-08-18 NOTE — Patient Instructions (Signed)
See me in 5-6 weeks 

## 2023-08-26 ENCOUNTER — Encounter: Payer: Self-pay | Admitting: Family Medicine

## 2023-09-14 NOTE — Progress Notes (Unsigned)
  [name] [address] [place] [address] Phone: [phone] Subjective:   Jaclyn [name], am serving as a scribe for Dr. Antoine Primas.  I'm seeing this patient by the request  of:  Patient, No Pcp Per  CC: Back and neck pain follow-up  [id number]  Shanyla Reiley is a 47 y.o. female coming in with complaint of back and neck pain. OMT on 08/18/23. Patient states that she continues to have tension in neck.   Medications patient has been prescribed: Vit D  Taking:         Reviewed prior external information including notes and imaging from previsou exam, outside providers and external EMR if available.   As well as notes that were available from care everywhere and other healthcare systems.  Past medical history, social, surgical and family history all reviewed in electronic medical record.  No pertanent information unless stated regarding to the chief complaint.   Past Medical History:  Diagnosis Date   Allergy    Anxiety    Asthma    Chicken pox    GERD (gastroesophageal reflux disease)    IBS (irritable bowel syndrome)    Pneumonia 2015    No Known Allergies   Review of Systems:  No , visual changes, nausea, vomiting, diarrhea, constipation, , abdominal pain, skin rash, fevers, chills, night sweats, weight loss, swollen lymph nodes, body aches, joint swelling, chest pain, shortness of breath, mood changes. POSITIVE muscle aches, headache, dizziness, hearing loss  Objective  There were no vitals taken for this visit.   General: No apparent distress alert and oriented x3 mood and affect normal, dressed appropriately.  HEENT: Pupils equal, extraocular movements intact  Respiratory: Patient's speak in full sentences and does not appear short of breath  Cardiovascular: No lower extremity edema, non tender, no erythema  Gait does have a minor wide-based gait noted MSK:  Back does have some loss lordosis noted.  Some  tenderness to palpation in the paraspinal musculature. Patient does have significant tightness around the neck. Osteopathic findings  T3 extended rotated and side bent right inhaled rib T9 extended rotated and side bent left L2 flexed rotated and side bent right Sacrum right on right       Assessment and Plan:  No problem-specific Assessment & Plan notes found for this encounter.    Nonallopathic problems  Decision today to treat with OMT was based on Physical Exam  After verbal consent patient was treated with HVLA, ME, FPR techniques in , rib, thoracic, lumbar, and sacral  areas deferred cervical secondary to patient's symptoms  Patient tolerated the procedure well with improvement in symptoms  Patient given exercises, stretches and lifestyle modifications  See medications in patient instructions if given  Patient will follow up in 4-8 weeks    The above documentation has been reviewed and is accurate and complete [name], DO          Note: This dictation was prepared with Dragon dictation along with smaller phrase technology. Any transcriptional errors that result from this process are unintentional.

## 2023-09-15 ENCOUNTER — Ambulatory Visit: Payer: 59 | Admitting: Family Medicine

## 2023-09-15 ENCOUNTER — Encounter: Payer: Self-pay | Admitting: Family Medicine

## 2023-09-15 VITALS — BP 112/76 | HR 76 | Ht 66.0 in

## 2023-09-15 DIAGNOSIS — M9904 Segmental and somatic dysfunction of sacral region: Secondary | ICD-10-CM | POA: Diagnosis not present

## 2023-09-15 DIAGNOSIS — M940 Chondrocostal junction syndrome [Tietze]: Secondary | ICD-10-CM

## 2023-09-15 DIAGNOSIS — M9908 Segmental and somatic dysfunction of rib cage: Secondary | ICD-10-CM | POA: Diagnosis not present

## 2023-09-15 DIAGNOSIS — M9902 Segmental and somatic dysfunction of thoracic region: Secondary | ICD-10-CM

## 2023-09-15 DIAGNOSIS — M9903 Segmental and somatic dysfunction of lumbar region: Secondary | ICD-10-CM | POA: Diagnosis not present

## 2023-09-15 DIAGNOSIS — H9190 Unspecified hearing loss, unspecified ear: Secondary | ICD-10-CM | POA: Diagnosis not present

## 2023-09-15 NOTE — Assessment & Plan Note (Signed)
Patient has had some hearing loss of late.  Down to 37% in the right ear.  Patient is having worsening headaches as well as some dizziness.  Feeling MRI of the brain with and without contrast would be beneficial for this individual.  Not ordered today to further evaluate.  Depending on findings we will discuss further.  Not taking any other type of medications that likely would be contributing.  Follow-up with me again in 6 to 8 weeks.

## 2023-09-15 NOTE — Assessment & Plan Note (Signed)
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  Secondary to the hypermobility that patient does have a I also think it is important for the advanced imaging of the brain secondary to see if there is any vascular component.  Follow-up with me again in 6 to 8 weeks otherwise.

## 2023-09-15 NOTE — Patient Instructions (Signed)
Good to see you! [facility] [phone] Call Today  When we receive your results we will contact you. See you again in 6-8 weeks

## 2023-10-05 ENCOUNTER — Encounter: Payer: Self-pay | Admitting: Family Medicine

## 2023-10-10 ENCOUNTER — Encounter: Payer: Self-pay | Admitting: Family Medicine

## 2023-10-11 ENCOUNTER — Other Ambulatory Visit: Payer: 59

## 2023-10-14 NOTE — Progress Notes (Signed)
[name] [address] [place] [address] Phone: [phone] Subjective:   INadine Counts, am serving as a scribe for Dr. Antoine Primas.  I'm seeing this patient by the request  of:  Patient, No Pcp Per  CC: back and neck pain follow up   [id number]  Esther Russon is a 47 y.o. female coming in with complaint of back and neck pain. OMT on 09/15/2023. Patient states same per usual. No new concerns.  Medications patient has been prescribed:   Taking:         Reviewed prior external information including notes and imaging from previsou exam, outside providers and external EMR if available.   As well as notes that were available from care everywhere and other healthcare systems.  Past medical history, social, surgical and family history all reviewed in electronic medical record.  No pertanent information unless stated regarding to the chief complaint.   Past Medical History:  Diagnosis Date   Allergy    Anxiety    Asthma    Chicken pox    GERD (gastroesophageal reflux disease)    IBS (irritable bowel syndrome)    Pneumonia 2015    No Known Allergies   Review of Systems:  No headache, visual changes, nausea, vomiting, diarrhea, constipation,  abdominal pain, skin rash, fevers, chills, night sweats, weight loss, swollen lymph nodes, body aches, joint swelling, chest pain, shortness of breath, mood changes. POSITIVE muscle aches, dizziness  Objective  Blood pressure 110/78, pulse 70, height 5\' 6"  (1.676 m), SpO2 96%.   General: No apparent distress alert and oriented x3 mood and affect normal, dressed appropriately.  HEENT: Pupils equal, extraocular movements intact  Respiratory: Patient's speak in full sentences and does not appear short of breath  Cardiovascular: No lower extremity edema, non tender, no erythema  Hypermobility noted. Patient's neck exam does have significant tightness bilaterally.  Tightness still noted  in the right parascapular area as well.  Still fairly severe than what would be anticipated.  Osteopathic findings  C3 flexed rotated and side bent right C7 flexed rotated and side bent left T3 extended rotated and side bent right inhaled rib T7 extended rotated and side bent left L5 flexed rotated and side bent left  Sacrum right on right    Assessment and Plan:  Slipped rib syndrome Slipped rib syndrome.  Discussed icing regimen and home exercises, which activities to do and which ones to avoid.  Will increase activity slowly.  Patient does feel that the TMJ is playing a role and will do more the mouthguard.  With the dizziness still around we did discuss the possibility of the MRI which patient has not followed through with yet.  Patient will make a decision at a later date on that.    Nonallopathic problems  Decision today to treat with OMT was based on Physical Exam  After verbal consent patient was treated with HVLA, ME, FPR techniques in cervical, rib, thoracic, lumbar, and sacral  areas  Patient tolerated the procedure well with improvement in symptoms  Patient given exercises, stretches and lifestyle modifications  See medications in patient instructions if given  Patient will follow up in 4-8 weeks     The above documentation has been reviewed and is accurate and complete [name], DO         Note: This dictation was prepared with Dragon dictation along with smaller phrase technology. Any transcriptional errors that result from this process are unintentional.

## 2023-10-21 ENCOUNTER — Ambulatory Visit: Payer: 59 | Admitting: Family Medicine

## 2023-10-21 ENCOUNTER — Encounter: Payer: Self-pay | Admitting: Family Medicine

## 2023-10-21 VITALS — BP 110/78 | HR 70 | Ht 66.0 in

## 2023-10-21 DIAGNOSIS — M9902 Segmental and somatic dysfunction of thoracic region: Secondary | ICD-10-CM

## 2023-10-21 DIAGNOSIS — M9908 Segmental and somatic dysfunction of rib cage: Secondary | ICD-10-CM

## 2023-10-21 DIAGNOSIS — M940 Chondrocostal junction syndrome [Tietze]: Secondary | ICD-10-CM | POA: Diagnosis not present

## 2023-10-21 DIAGNOSIS — M9904 Segmental and somatic dysfunction of sacral region: Secondary | ICD-10-CM | POA: Diagnosis not present

## 2023-10-21 DIAGNOSIS — M9903 Segmental and somatic dysfunction of lumbar region: Secondary | ICD-10-CM

## 2023-10-21 DIAGNOSIS — M9901 Segmental and somatic dysfunction of cervical region: Secondary | ICD-10-CM

## 2023-10-21 NOTE — Assessment & Plan Note (Signed)
Slipped rib syndrome.  Discussed icing regimen and home exercises, which activities to do and which ones to avoid.  Will increase activity slowly.  Patient does feel that the TMJ is playing a role and will do more the mouthguard.  With the dizziness still around we did discuss the possibility of the MRI which patient has not followed through with yet.  Patient will make a decision at a later date on that.

## 2023-11-16 ENCOUNTER — Encounter: Payer: Self-pay | Admitting: Family Medicine

## 2023-11-16 NOTE — Telephone Encounter (Signed)
Patient called in regards to this. She now has [employer] and they are not in-network with [facility].   She is not sure who is.  Jari Favre: ID # [id number] Phone # [phone] (I have added this to her chart)  Can this be re-ordered to another location/authorized or what do you think?

## 2023-11-17 ENCOUNTER — Other Ambulatory Visit: Payer: 59

## 2023-11-17 NOTE — Addendum Note (Signed)
Addended by: Evon Slack on: 11/17/2023 11:57 AM   Modules accepted: Orders

## 2023-11-23 NOTE — Progress Notes (Unsigned)
[name] [address] [place] [address] Phone: [phone] Subjective:   Jaclyn [name], am serving as a scribe for Dr. Antoine Primas.  I'm seeing this patient by the request  of:  Patient, No Pcp Per  CC: back and neck pain follow up   [id number]  Jaclyn [name] is a 48 y.o. female coming in with complaint of back and neck pain. OMT on 10/21/2023. Patient states that her jaw in painful.  Patient has been fairly active.  Is seeing a new dentist for the job has been taking ibuprofen though and unfortunately started having more difficulty.  Feels like it started hurting her stomach and wants to know what else she could potentially take  Medications patient has been prescribed:   Taking:  Seen dentist        Reviewed prior external information including notes and imaging from previsou exam, outside providers and external EMR if available.   As well as notes that were available from care everywhere and other healthcare systems.  Past medical history, social, surgical and family history all reviewed in electronic medical record.  No pertanent information unless stated regarding to the chief complaint.   Past Medical History:  Diagnosis Date   Allergy    Anxiety    Asthma    Chicken pox    GERD (gastroesophageal reflux disease)    IBS (irritable bowel syndrome)    Pneumonia 2015    No Known Allergies   Review of Systems:  No headache, visual changes, nausea, vomiting, diarrhea, constipation, dizziness,  skin rash, fevers, chills, night sweats, weight loss, swollen lymph nodes, body aches, joint swelling, chest pain, shortness of breath, mood changes. POSITIVE muscle aches, abdominal pain  Objective  Blood pressure 110/68, height 5\' 6"  (1.676 m).   General: No apparent distress alert and oriented x3 mood and affect normal, dressed appropriately.  HEENT: Pupils equal, extraocular movements intact  Respiratory: Patient's  speak in full sentences and does not appear short of breath  Cardiovascular: No lower extremity edema, non tender, no erythema  Gait MSK:  Back does have some loss lordosis noted.  Some tenderness to palpation in the paraspinal musculature.  Tightness in the parascapular area on the right side.  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  Could be contributing to some of it.  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  Discussed different medications.  Given Celebrex as well as Protonix.  Patient has had difficulty with her stomach secondary to ibuprofen.  Follow-up again in 6 to 8 weeks    Nonallopathic problems  Decision today to treat with OMT was based on Physical Exam  After verbal consent patient was treated with HVLA, ME, FPR techniques in cervical, rib, thoracic, lumbar, and sacral  areas  Patient tolerated the procedure well with improvement in symptoms  Patient given exercises, stretches and lifestyle modifications  See medications in patient instructions if given  Patient will follow up in 4-8 weeks    The above documentation has been reviewed and is accurate and complete Jaclyn [name]  Jaclyn Blazing, DO          Note: This dictation was prepared with Dragon dictation along with smaller phrase technology. Any transcriptional errors that result from this process are unintentional.

## 2023-11-24 ENCOUNTER — Ambulatory Visit: Payer: 59 | Admitting: Family Medicine

## 2023-11-24 ENCOUNTER — Encounter: Payer: Self-pay | Admitting: Family Medicine

## 2023-11-24 VITALS — BP 110/68 | Ht 66.0 in

## 2023-11-24 DIAGNOSIS — M9904 Segmental and somatic dysfunction of sacral region: Secondary | ICD-10-CM

## 2023-11-24 DIAGNOSIS — M940 Chondrocostal junction syndrome [Tietze]: Secondary | ICD-10-CM

## 2023-11-24 DIAGNOSIS — M9903 Segmental and somatic dysfunction of lumbar region: Secondary | ICD-10-CM | POA: Diagnosis not present

## 2023-11-24 DIAGNOSIS — M9908 Segmental and somatic dysfunction of rib cage: Secondary | ICD-10-CM

## 2023-11-24 DIAGNOSIS — M9902 Segmental and somatic dysfunction of thoracic region: Secondary | ICD-10-CM

## 2023-11-24 DIAGNOSIS — M9901 Segmental and somatic dysfunction of cervical region: Secondary | ICD-10-CM

## 2023-11-24 MED ORDER — CELECOXIB 100 MG PO CAPS: 100.0000 mg | ORAL_CAPSULE | Freq: Two times a day (BID) | ORAL | 3 refills | Status: DC | PRN

## 2023-11-24 MED ORDER — PANTOPRAZOLE SODIUM 40 MG PO TBEC: 40.0000 mg | DELAYED_RELEASE_TABLET | Freq: Two times a day (BID) | ORAL | 3 refills | Status: DC

## 2023-11-24 NOTE — Patient Instructions (Signed)
Protonix 40mg  2x a day for 2 weeks Celebrex 100mg  2x a day prn See me in 6-8 weeks

## 2023-11-24 NOTE — Assessment & Plan Note (Signed)
Continue slipped rib syndrome.  Could be contributing to some of it.  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  Discussed different medications.  Given Celebrex as well as Protonix.  Patient has had difficulty with her stomach secondary to ibuprofen.  Follow-up again in 6 to 8 weeks

## 2023-11-25 ENCOUNTER — Encounter: Payer: Self-pay | Admitting: Family Medicine

## 2023-11-28 MED ORDER — TIZANIDINE HCL 2 MG PO CAPS: 2.0000 mg | ORAL_CAPSULE | Freq: Every day | ORAL | 1 refills | Status: DC

## 2023-12-27 NOTE — Progress Notes (Unsigned)
  [name] [address] [place] [address] Phone: [phone] Subjective:   [name], am serving as a scribe for Dr. Antoine Primas.  I'm seeing this patient by the request  of:  Patient, No Pcp Per  CC: Back and neck pain follow-up  [id number]  Jaclyn [name] is a 48 y.o. female coming in with complaint of back and neck pain. OMT on 11/24/2023. Patient states that a few ribs are out but no new issues.   Medications patient has been prescribed: protonix celebrex zanaflex  Taking:         Reviewed prior external information including notes and imaging from previsou exam, outside providers and external EMR if available.   As well as notes that were available from care everywhere and other healthcare systems.  Past medical history, social, surgical and family history all reviewed in electronic medical record.  No pertanent information unless stated regarding to the chief complaint.   Past Medical History:  Diagnosis Date   Allergy    Anxiety    Asthma    Chicken pox    GERD (gastroesophageal reflux disease)    IBS (irritable bowel syndrome)    Pneumonia 2015    No Known Allergies   Review of Systems:  No headache, visual changes, nausea, vomiting, diarrhea, constipation, dizziness, abdominal pain, skin rash, fevers, chills, night sweats, weight loss, swollen lymph nodes, body aches, joint swelling, chest pain, shortness of breath, mood changes. POSITIVE muscle aches  Objective  Blood pressure 104/70, height 5\' 6"  (1.676 m).   General: No apparent distress alert and oriented x3 mood and affect normal, dressed appropriately.  HEENT: Pupils equal, extraocular movements intact  Respiratory: Patient's speak in full sentences and does not appear short of breath  Cardiovascular: No lower extremity edema, non tender, no erythema  Gait MSK:  Back still has tightness noted in the parascapular area right greater than left.   Patient does have some limitation in sidebending of the neck noted.  Osteopathic findings  C2 flexed rotated and side bent right C6 flexed rotated and side bent left T3 extended rotated and side bent right inhaled rib T9 extended rotated and side bent left L2 flexed rotated and side bent right Sacrum right on right       Assessment and Plan:  Slipped rib syndrome Continues to have some discomfort.  Discussed icing regimen and home exercises, increase Celebrex to 200 mg twice a day to see if this would improve.  Patient is taking the Protonix as prescribed as well.  Follow-up again in 6 to 8 weeks    Nonallopathic problems  Decision today to treat with OMT was based on Physical Exam  After verbal consent patient was treated with HVLA, ME, FPR techniques in cervical, rib, thoracic, lumbar, and sacral  areas  Patient tolerated the procedure well with improvement in symptoms  Patient given exercises, stretches and lifestyle modifications  See medications in patient instructions if given  Patient will follow up in 4-8 weeks    The above documentation has been reviewed and is accurate and complete [name], DO          Note: This dictation was prepared with Dragon dictation along with smaller phrase technology. Any transcriptional errors that result from this process are unintentional.

## 2023-12-29 ENCOUNTER — Ambulatory Visit (INDEPENDENT_AMBULATORY_CARE_PROVIDER_SITE_OTHER): Payer: Self-pay | Admitting: Family Medicine

## 2023-12-29 ENCOUNTER — Encounter: Payer: Self-pay | Admitting: Family Medicine

## 2023-12-29 VITALS — BP 104/70 | Ht 66.0 in

## 2023-12-29 DIAGNOSIS — M9903 Segmental and somatic dysfunction of lumbar region: Secondary | ICD-10-CM

## 2023-12-29 DIAGNOSIS — M9901 Segmental and somatic dysfunction of cervical region: Secondary | ICD-10-CM

## 2023-12-29 DIAGNOSIS — M940 Chondrocostal junction syndrome [Tietze]: Secondary | ICD-10-CM

## 2023-12-29 DIAGNOSIS — M9908 Segmental and somatic dysfunction of rib cage: Secondary | ICD-10-CM

## 2023-12-29 DIAGNOSIS — M9902 Segmental and somatic dysfunction of thoracic region: Secondary | ICD-10-CM

## 2023-12-29 DIAGNOSIS — M9904 Segmental and somatic dysfunction of sacral region: Secondary | ICD-10-CM

## 2023-12-29 MED ORDER — CELECOXIB 100 MG PO CAPS: 200.0000 mg | ORAL_CAPSULE | Freq: Two times a day (BID) | ORAL | 1 refills | Status: DC

## 2023-12-29 NOTE — Assessment & Plan Note (Signed)
 Continues to have some discomfort.  Discussed icing regimen and home exercises, increase Celebrex to 200 mg twice a day to see if this would improve.  Patient is taking the Protonix as prescribed as well.  Follow-up again in 6 to 8 weeks

## 2023-12-29 NOTE — Patient Instructions (Addendum)
 Thanks for the snacks Celebrex 200mg  2x a day See you again in 6-8 weeks

## 2024-01-16 ENCOUNTER — Other Ambulatory Visit: Payer: Self-pay

## 2024-01-16 ENCOUNTER — Telehealth: Payer: Self-pay | Admitting: Family Medicine

## 2024-01-16 MED ORDER — CELECOXIB 200 MG PO CAPS
200.0000 mg | ORAL_CAPSULE | Freq: Two times a day (BID) | ORAL | 0 refills | Status: AC
Start: 2024-01-16 — End: ?

## 2024-01-16 NOTE — Telephone Encounter (Signed)
 Pt called requesting another rx with correct dosage for the celebrex.  She is supposed to take 200mg  twice per day, please prescribe the 200 mg dose.  The one we did on 12/29/2023 was based on the 100mg  dose and only provider enough pills for 15 days.  [facility].

## 2024-01-25 ENCOUNTER — Encounter: Payer: Self-pay | Admitting: Family Medicine

## 2024-01-26 ENCOUNTER — Other Ambulatory Visit: Payer: Self-pay

## 2024-01-26 MED ORDER — TIZANIDINE HCL 2 MG PO CAPS: 2.0000 mg | ORAL_CAPSULE | Freq: Three times a day (TID) | ORAL | 0 refills | Status: DC

## 2024-01-27 NOTE — Progress Notes (Signed)
 [name] [address] [place] [address] Phone: [phone] Subjective:   Jaclyn [name], am serving as a scribe for Dr. Antoine Primas.  I'm seeing this patient by the request  of:  Patient, No Pcp Per  CC: Back and neck pain follow-up  [id number]  Nikyah [name] is a 48 y.o. female coming in with complaint of back and neck pain. OMT on 12/29/2023. Patient states that she find celebrex seems to be helping her pain. Jaw pain is slowly improving.   Medications patient has been prescribed: zanaflex  celebrex  Taking:         Reviewed prior external information including notes and imaging from previsou exam, outside providers and external EMR if available.   As well as notes that were available from care everywhere and other healthcare systems.  Past medical history, social, surgical and family history all reviewed in electronic medical record.  No pertanent information unless stated regarding to the chief complaint.   Past Medical History:  Diagnosis Date   Allergy    Anxiety    Asthma    Chicken pox    GERD (gastroesophageal reflux disease)    IBS (irritable bowel syndrome)    Pneumonia 2015    No Known Allergies   Review of Systems:  No  visual changes, nausea, vomiting, diarrhea, constipation, dizziness, abdominal pain, skin rash, fevers, chills, night sweats, weight loss, swollen lymph nodes, body aches, joint swelling, chest pain, shortness of breath, mood changes. POSITIVE muscle aches, headache  Objective  Blood pressure 110/76, pulse 76, height 5\' 6"  (1.676 m), SpO2 98%.   General: No apparent distress alert and oriented x3 mood and affect normal, dressed appropriately.  HEENT: Pupils equal, extraocular movements intact  Respiratory: Patient's speak in full sentences and does not appear short of breath  Cardiovascular: No lower extremity edema, non tender, no erythema  Gait normal MSK:  Back does have some loss  lordosis noted.  Some tenderness to palpation noted.  Tightness with FABER testing bilaterally.  Tightness in the parascapular area on the right side.  Osteopathic findings  C3 flexed rotated and side bent right C7 flexed rotated and side bent left T3 extended rotated and side bent right inhaled rib T9 extended rotated and side bent left L5 flexed rotated and side bent right Sacrum right on right       Assessment and Plan:  Slipped rib syndrome Continue to work on posture and ergonomics, discussed icing regimen and home exercises, which activities to do and which ones to avoid.  Increase activity slowly.  Discussed icing regimen.  Follow-up again in 6 to 8 weeks  Low back pain Low back does have some loss lordosis noted.  Some tenderness to palpation in the paraspinal musculature.  Tightness with Pearlean Brownie right greater than left.  Will continue to monitor.  Patient has had some more difficulty with discomfort as well.  Follow-up again in 6 to 8 weeks Discussed that Zanaflex 2 mg to take 2-3 times daily  Nonallopathic problems  Decision today to treat with OMT was based on Physical Exam  After verbal consent patient was treated with HVLA, ME, FPR techniques in cervical, rib, thoracic, lumbar, and sacral  areas  Patient tolerated the procedure well with improvement in symptoms  Patient given exercises, stretches and lifestyle modifications  See medications in patient instructions if given  Patient will follow up in 4-8 weeks    The above documentation has been reviewed and is  accurate and complete [name], DO          Note: This dictation was prepared with Dragon dictation along with smaller phrase technology. Any transcriptional errors that result from this process are unintentional.

## 2024-02-01 ENCOUNTER — Encounter: Payer: Self-pay | Admitting: Family Medicine

## 2024-02-01 ENCOUNTER — Ambulatory Visit: Payer: 59 | Admitting: Family Medicine

## 2024-02-01 VITALS — BP 110/76 | HR 76 | Ht 66.0 in

## 2024-02-01 DIAGNOSIS — G8929 Other chronic pain: Secondary | ICD-10-CM

## 2024-02-01 DIAGNOSIS — M9903 Segmental and somatic dysfunction of lumbar region: Secondary | ICD-10-CM

## 2024-02-01 DIAGNOSIS — M9904 Segmental and somatic dysfunction of sacral region: Secondary | ICD-10-CM

## 2024-02-01 DIAGNOSIS — M940 Chondrocostal junction syndrome [Tietze]: Secondary | ICD-10-CM

## 2024-02-01 DIAGNOSIS — M9902 Segmental and somatic dysfunction of thoracic region: Secondary | ICD-10-CM

## 2024-02-01 DIAGNOSIS — M9908 Segmental and somatic dysfunction of rib cage: Secondary | ICD-10-CM

## 2024-02-01 DIAGNOSIS — M545 Low back pain, unspecified: Secondary | ICD-10-CM

## 2024-02-01 DIAGNOSIS — M9901 Segmental and somatic dysfunction of cervical region: Secondary | ICD-10-CM

## 2024-02-01 NOTE — Assessment & Plan Note (Signed)
 Continue to work on [employer], discussed icing regimen and home exercises, which activities to do and which ones to avoid.  Increase activity slowly.  Discussed icing regimen.  Follow-up again in 6 to 8 weeks

## 2024-02-01 NOTE — Patient Instructions (Signed)
 Good to see you  Tell Jaclyn [name] to sleep on the couch  Keep working on the jaw See me again in 6-8 weeks

## 2024-02-01 NOTE — Assessment & Plan Note (Signed)
 Low back does have some loss lordosis noted.  Some tenderness to palpation in the paraspinal musculature.  Tightness with Pearlean Brownie right greater than left.  Will continue to monitor.  Patient has had some more difficulty with discomfort as well.  Follow-up again in 6 to 8 weeks

## 2024-02-02 ENCOUNTER — Encounter: Payer: Self-pay | Admitting: Family Medicine

## 2024-02-17 ENCOUNTER — Encounter: Payer: Self-pay | Admitting: Family Medicine

## 2024-02-17 ENCOUNTER — Other Ambulatory Visit: Payer: Self-pay

## 2024-02-17 MED ORDER — PREDNISONE 20 MG PO TABS: 20.0000 mg | ORAL_TABLET | Freq: Every day | ORAL | 0 refills | Status: AC

## 2024-02-27 ENCOUNTER — Encounter: Payer: Self-pay | Admitting: Family Medicine

## 2024-02-27 ENCOUNTER — Ambulatory Visit (INDEPENDENT_AMBULATORY_CARE_PROVIDER_SITE_OTHER): Payer: Self-pay | Admitting: Family Medicine

## 2024-02-27 VITALS — BP 110/82 | HR 72 | Ht 66.0 in

## 2024-02-27 DIAGNOSIS — M940 Chondrocostal junction syndrome [Tietze]: Secondary | ICD-10-CM

## 2024-02-27 DIAGNOSIS — M9902 Segmental and somatic dysfunction of thoracic region: Secondary | ICD-10-CM

## 2024-02-27 DIAGNOSIS — M9903 Segmental and somatic dysfunction of lumbar region: Secondary | ICD-10-CM

## 2024-02-27 DIAGNOSIS — M2550 Pain in unspecified joint: Secondary | ICD-10-CM

## 2024-02-27 DIAGNOSIS — M9901 Segmental and somatic dysfunction of cervical region: Secondary | ICD-10-CM

## 2024-02-27 DIAGNOSIS — M79672 Pain in left foot: Secondary | ICD-10-CM

## 2024-02-27 DIAGNOSIS — M9908 Segmental and somatic dysfunction of rib cage: Secondary | ICD-10-CM

## 2024-02-27 DIAGNOSIS — M9904 Segmental and somatic dysfunction of sacral region: Secondary | ICD-10-CM

## 2024-02-27 LAB — TSH: TSH: 2.87 u[IU]/mL (ref 0.35–5.50)

## 2024-02-27 LAB — CBC WITH DIFFERENTIAL/PLATELET
Basophils Absolute: 0.1 10*3/uL (ref 0.0–0.1)
Basophils Relative: 1.4 % (ref 0.0–3.0)
Eosinophils Absolute: 0.2 10*3/uL (ref 0.0–0.7)
Eosinophils Relative: 2.4 % (ref 0.0–5.0)
HCT: 39.8 % (ref 36.0–46.0)
Hemoglobin: 13.1 g/dL (ref 12.0–15.0)
Lymphocytes Relative: 14.6 % (ref 12.0–46.0)
Lymphs Abs: 1 10*3/uL (ref 0.7–4.0)
MCHC: 32.9 g/dL (ref 30.0–36.0)
MCV: 90.5 fl (ref 78.0–100.0)
Monocytes Absolute: 0.9 10*3/uL (ref 0.1–1.0)
Monocytes Relative: 14 % — ABNORMAL HIGH (ref 3.0–12.0)
Neutro Abs: 4.5 10*3/uL (ref 1.4–7.7)
Neutrophils Relative %: 67.6 % (ref 43.0–77.0)
Platelets: 306 10*3/uL (ref 150.0–400.0)
RBC: 4.4 Mil/uL (ref 3.87–5.11)
RDW: 13.9 % (ref 11.5–15.5)
WBC: 6.6 10*3/uL (ref 4.0–10.5)

## 2024-02-27 LAB — COMPREHENSIVE METABOLIC PANEL WITH GFR
ALT: 8 U/L (ref 0–35)
AST: 14 U/L (ref 0–37)
Albumin: 4.2 g/dL (ref 3.5–5.2)
Alkaline Phosphatase: 49 U/L (ref 39–117)
BUN: 11 mg/dL (ref 6–23)
CO2: 27 meq/L (ref 19–32)
Calcium: 9.2 mg/dL (ref 8.4–10.5)
Chloride: 102 meq/L (ref 96–112)
Creatinine, Ser: 0.94 mg/dL (ref 0.40–1.20)
GFR: 71.23 mL/min (ref >60.00)
Glucose, Bld: 76 mg/dL (ref 70–99)
Potassium: 3.9 meq/L (ref 3.5–5.1)
Sodium: 138 meq/L (ref 135–145)
Total Bilirubin: 0.8 mg/dL (ref 0.2–1.2)
Total Protein: 7.3 g/dL (ref 6.0–8.3)

## 2024-02-27 LAB — IBC PANEL
Iron: 125 ug/dL (ref 42–145)
Saturation Ratios: 38.2 % (ref 20.0–50.0)
TIBC: 327.6 ug/dL (ref 250.0–450.0)
Transferrin: 234 mg/dL (ref 212.0–360.0)

## 2024-02-27 LAB — SEDIMENTATION RATE: Sed Rate: 24 mm/h — ABNORMAL HIGH (ref 0–20)

## 2024-02-27 LAB — URIC ACID: Uric Acid, Serum: 4.3 mg/dL (ref 2.4–7.0)

## 2024-02-27 LAB — C-REACTIVE PROTEIN: CRP: <1 mg/dL (ref 0.5–20.0)

## 2024-02-27 LAB — VITAMIN D 25 HYDROXY (VIT D DEFICIENCY, FRACTURES): VITD: 82.26 ng/mL (ref 30.00–100.00)

## 2024-02-27 LAB — VITAMIN B12: Vitamin B-12: >1537 pg/mL — ABNORMAL HIGH (ref 211–911)

## 2024-02-27 LAB — FERRITIN: Ferritin: 28.7 ng/mL (ref 10.0–291.0)

## 2024-02-27 MED ORDER — KETOROLAC TROMETHAMINE 60 MG/2ML IM SOLN
60.0000 mg | Freq: Once | INTRAMUSCULAR | Status: AC
Start: 2024-02-27 — End: 2024-02-27
  Administered 2024-02-27: 60 mg via INTRAMUSCULAR

## 2024-02-27 MED ORDER — METHYLPREDNISOLONE ACETATE 80 MG/ML IJ SUSP
80.0000 mg | Freq: Once | INTRAMUSCULAR | Status: AC
  Administered 2024-02-27: 80 mg via INTRAMUSCULAR

## 2024-02-27 NOTE — Assessment & Plan Note (Signed)
 Significant exacerbation noted again.  Did not make sense with patient not doing any significant difference in activity on medications.  Feel at this point advanced imaging would be warranted but will hold on that but laboratory workup is necessary.  Discussed icing regimen of home exercises otherwise.  Increase activity slowly.  Discussed icing regimen.  Follow-up again after labs to further evaluate if anything else needs to be changed at this time.  Patient is in agreement with the plan.

## 2024-02-27 NOTE — Patient Instructions (Addendum)
 Labs today Full cocktail today.  See you again at next appointment

## 2024-02-27 NOTE — Progress Notes (Signed)
 [facility] [address] [place] [address] Phone: [phone] Subjective:   Jaclyn [name], am serving as a scribe for Dr. Ronnell Coins.  I'm seeing this patient by the request  of:  Patient, No Pcp Per  CC: Back and neck pain  [id number]  Jaclyn [name] is a 48 y.o. female coming in with complaint of back and neck pain. OMT 02/01/2024. Patient states that she got a cold and this messed up her ribs. Patient had an in foot, hips. Prednisone  was hard on her stomach.          Reviewed prior external information including notes and imaging from previsou exam, outside providers and external EMR if available.   As well as notes that were available from care everywhere and other healthcare systems.  Past medical history, social, surgical and family history all reviewed in electronic medical record.  No pertanent information unless stated regarding to the chief complaint.   Past Medical History:  Diagnosis Date   Allergy    Anxiety    Asthma    Chicken pox    GERD (gastroesophageal reflux disease)    IBS (irritable bowel syndrome)    Pneumonia 2015    No Known Allergies   Review of Systems:  No headache, visual changes, nausea, vomiting, diarrhea, constipation, dizziness, abdominal pain, skin rash, fevers, chills, night sweats, weight loss, swollen lymph nodes, body aches, joint swelling, chest pain, shortness of breath, mood changes. POSITIVE muscle aches  Objective  Blood pressure 110/82, pulse 72, height 5\' 6"  (1.676 m), SpO2 99%.   General: No apparent distress alert and oriented x3 mood and affect normal, dressed appropriately.  HEENT: Pupils equal, extraocular movements intact  Respiratory: Patient's speak in full sentences and does not appear short of breath  Cardiovascular: No lower extremity edema, non tender, no erythema  Gait normal MSK:  Back does have some loss lordosis noted.  Patient has significant tightness  within the right parascapular area.  Negative straight leg test noted today.  Patient's neck exam does have some loss of sidebending bilaterally.  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  Did not make sense with patient not doing any significant difference in activity on medications.  Feel at this point advanced imaging would be warranted but will hold on that but laboratory workup is necessary.  Discussed icing regimen of home exercises otherwise.  Increase activity slowly.  Discussed icing regimen.  Follow-up again after labs to further evaluate if anything else needs to be changed at this time.  Patient is in agreement with the plan.    Nonallopathic problems  Decision today to treat with OMT was based on Physical Exam  After verbal consent patient was treated with HVLA, ME, FPR techniques in cervical, rib, thoracic, lumbar, and sacral  areas  Patient tolerated the procedure well with improvement in symptoms  Patient given exercises, stretches and lifestyle modifications  See medications in patient instructions if given  Patient will follow up in 4-8 weeks     The above documentation has been reviewed and is accurate and complete Cai Flott M Theran Vandergrift, DO  Note: This dictation was prepared with Dragon dictation along with smaller phrase technology. Any transcriptional errors that result from this process are unintentional.

## 2024-02-28 ENCOUNTER — Encounter: Payer: Self-pay | Admitting: Family Medicine

## 2024-02-28 LAB — PTH, INTACT AND CALCIUM
Calcium: 9.3 mg/dL (ref 8.6–10.2)
PTH: 30 pg/mL (ref 16–77)

## 2024-02-29 ENCOUNTER — Encounter: Payer: Self-pay | Admitting: Family Medicine

## 2024-03-01 ENCOUNTER — Encounter: Payer: Self-pay | Admitting: Family Medicine

## 2024-03-01 LAB — ANGIOTENSIN CONVERTING ENZYME: Angiotensin-Converting Enzyme: 37 U/L (ref 9–67)

## 2024-03-01 LAB — RHEUMATOID FACTOR: Rheumatoid fact SerPl-aCnc: 10 [IU]/mL (ref <14)

## 2024-03-01 LAB — CA 125: CA 125: 28 U/mL (ref <35)

## 2024-03-01 LAB — CEA: CEA: <2 ng/mL

## 2024-03-01 LAB — ANTI-NUCLEAR AB-TITER (ANA TITER): ANA Titer 1: 1:40 {titer} — ABNORMAL HIGH

## 2024-03-01 LAB — ANA: Anti Nuclear Antibody (ANA): POSITIVE — AB

## 2024-03-01 LAB — CALCIUM, IONIZED: Calcium, Ion: 5.2 mg/dL (ref 4.7–5.5)

## 2024-03-01 LAB — CYCLIC CITRUL PEPTIDE ANTIBODY, IGG: Cyclic Citrullin Peptide Ab: <16 U

## 2024-03-06 NOTE — Progress Notes (Unsigned)
  [name] [address] [place] [address] Phone: [phone] Subjective:   Jaclyn [name], am serving as a scribe for Dr. Ronnell Coins.  I'm seeing this patient by the request  of:  Patient, No Pcp Per  CC: Back and neck pain follow-up  [id number]  Jaclyn [name] is a 48 y.o. female coming in with complaint of back and neck pain. OMT on 02/27/2024. Patient states same per usual. Talk about  Medications patient has been prescribed:   Taking:         Reviewed prior external information including notes and imaging from previsou exam, outside providers and external EMR if available.   As well as notes that were available from care everywhere and other healthcare systems.  Past medical history, social, surgical and family history all reviewed in electronic medical record.  No pertanent information unless stated regarding to the chief complaint.   Past Medical History:  Diagnosis Date   Allergy    Anxiety    Asthma    Chicken pox    GERD (gastroesophageal reflux disease)    IBS (irritable bowel syndrome)    Pneumonia 2015    No Known Allergies   Review of Systems:  No headache, visual changes, nausea, vomiting, diarrhea, constipation, dizziness, abdominal pain, skin rash, fevers, chills, night sweats, weight loss, swollen lymph nodes, body aches, joint swelling, chest pain, shortness of breath, mood changes. POSITIVE muscle aches  Objective  There were no vitals taken for this visit.   General: No apparent distress alert and oriented x3 mood and affect normal, dressed appropriately.  HEENT: Pupils equal, extraocular movements intact  Respiratory: Patient's speak in full sentences and does not appear short of breath  Cardiovascular: No lower extremity edema, non tender, no erythema  Gait MSK:  Back   Osteopathic findings  C2 flexed rotated and side bent right C6 flexed rotated and side bent left T3 extended rotated  and side bent right inhaled rib T9 extended rotated and side bent left L2 flexed rotated and side bent right Sacrum right on right       Assessment and Plan:  No problem-specific Assessment & Plan notes found for this encounter.    Nonallopathic problems  Decision today to treat with OMT was based on Physical Exam  After verbal consent patient was treated with HVLA, ME, FPR techniques in cervical, rib, thoracic, lumbar, and sacral  areas  Patient tolerated the procedure well with improvement in symptoms  Patient given exercises, stretches and lifestyle modifications  See medications in patient instructions if given  Patient will follow up in 4-8 weeks    The above documentation has been reviewed and is accurate and complete Jaclyn Fahrney M [name], DO          Note: This dictation was prepared with Dragon dictation along with smaller phrase technology. Any transcriptional errors that result from this process are unintentional.

## 2024-03-07 ENCOUNTER — Ambulatory Visit: Payer: Self-pay | Admitting: Family Medicine

## 2024-03-07 VITALS — BP 104/64 | HR 75 | Ht 66.0 in

## 2024-03-07 DIAGNOSIS — M9902 Segmental and somatic dysfunction of thoracic region: Secondary | ICD-10-CM

## 2024-03-07 DIAGNOSIS — M940 Chondrocostal junction syndrome [Tietze]: Secondary | ICD-10-CM

## 2024-03-07 DIAGNOSIS — M9903 Segmental and somatic dysfunction of lumbar region: Secondary | ICD-10-CM

## 2024-03-07 DIAGNOSIS — M9908 Segmental and somatic dysfunction of rib cage: Secondary | ICD-10-CM

## 2024-03-07 DIAGNOSIS — M9904 Segmental and somatic dysfunction of sacral region: Secondary | ICD-10-CM

## 2024-03-07 DIAGNOSIS — M545 Low back pain, unspecified: Secondary | ICD-10-CM

## 2024-03-07 DIAGNOSIS — G8929 Other chronic pain: Secondary | ICD-10-CM

## 2024-03-07 DIAGNOSIS — M9901 Segmental and somatic dysfunction of cervical region: Secondary | ICD-10-CM

## 2024-03-07 MED ORDER — METHYLPREDNISOLONE ACETATE 80 MG/ML IJ SUSP
80.0000 mg | Freq: Once | INTRAMUSCULAR | Status: AC
  Administered 2024-03-07: 80 mg via INTRAMUSCULAR

## 2024-03-07 MED ORDER — KETOROLAC TROMETHAMINE 30 MG/ML IJ SOLN
60.0000 mg | Freq: Once | INTRAMUSCULAR | Status: AC
  Administered 2024-03-07: 60 mg via INTRAMUSCULAR

## 2024-03-07 NOTE — Assessment & Plan Note (Addendum)
 Continues to have chronic discomfort and pain.  Seen 9 days ago.  Discussed potential CT of the chest.  Patient wants to hold at this time.  Toradol  and Depo-Medrol  injections given again today.  Will follow-up again in 6 to 8 weeks

## 2024-03-07 NOTE — Patient Instructions (Addendum)
 Full Cocktail injection See you again in 6 weeks

## 2024-03-08 ENCOUNTER — Encounter: Payer: Self-pay | Admitting: Family Medicine

## 2024-03-14 ENCOUNTER — Encounter: Payer: Self-pay | Admitting: Family Medicine

## 2024-04-11 NOTE — Progress Notes (Signed)
 [name] [address] [place] [address] Phone: [phone] Subjective:   Jaclyn [name], am serving as a scribe for Dr. Arthea Claudene.  I'm seeing this patient by the request  of:  Patient, No Pcp Per  CC: back and neck pain   YEP:Dlagzrupcz  Jaclyn [name] is a 48 y.o. female coming in with complaint of back and neck pain. OMT On 03/07/2024. Patient states that she has been doing ok.   Also c/o R knee and R foot. Pain over medial joint. Pain has been interment.   Pain in medial midfoot. Feels like it needs to pop.   Medications patient has been prescribed:   Taking:         Reviewed prior external information including notes and imaging from previsou exam, outside providers and external EMR if available.   As well as notes that were available from care everywhere and other healthcare systems.  Past medical history, social, surgical and family history all reviewed in electronic medical record.  No pertanent information unless stated regarding to the chief complaint.   Past Medical History:  Diagnosis Date   Allergy    Anxiety    Asthma    Chicken pox    GERD (gastroesophageal reflux disease)    IBS (irritable bowel syndrome)    Pneumonia 2015    No Known Allergies   Review of Systems:  No headache, visual changes, nausea, vomiting, diarrhea, constipation, dizziness, abdominal pain, skin rash, fevers, chills, night sweats, weight loss, swollen lymph nodes, body aches, joint swelling, chest pain, shortness of breath, mood changes. POSITIVE muscle aches  Objective  Blood pressure 104/64, pulse 70, height 5' 6 (1.676 m), SpO2 98%.   General: No apparent distress alert and oriented x3 mood and affect normal, dressed appropriately.  HEENT: Pupils equal, extraocular movements intact  Respiratory: Patient's speak in full sentences and does not appear short of breath  Cardiovascular: No lower extremity edema, non tender, no  erythema  Gait relatively normal MSK:  Back significant tenderness still noted in the greater trochanteric area on the right side is well as the parascapular area on the right side in the thoracic spine.  Some limited sidebending of the neck bilaterally right greater than left.  Pain out of proportion to the amount of palpation diffusely in the axial skeleton  Osteopathic findings  C2 flexed rotated and side bent right C5 flexed rotated and side bent left T3 extended rotated and side bent right inhaled rib T7 extended rotated and side bent left L2 flexed rotated and side bent right Sacrum right on right       Assessment and Plan:  Slipped rib syndrome Has more muscle improvement noted.  Discussed icing regimen and home exercises, discussed which activities increase activity slowly over the course of next several days.  Nothing severe and no changes in management at this time.  Follow-up again in 6 to 8 weeks    Nonallopathic problems  Decision today to treat with OMT was based on Physical Exam  After verbal consent patient was treated with HVLA, ME, FPR techniques in cervical, rib, thoracic, lumbar, and sacral  areas  Patient tolerated the procedure well with improvement in symptoms  Patient given exercises, stretches and lifestyle modifications  See medications in patient instructions if given  Patient will follow up in 4-8 weeks     The above documentation has been reviewed and is accurate and complete Kadie Balestrieri M Eluzer Howdeshell, DO  Note: This dictation was prepared with Dragon dictation along with smaller phrase technology. Any transcriptional errors that result from this process are unintentional.

## 2024-04-12 ENCOUNTER — Encounter: Payer: Self-pay | Admitting: Family Medicine

## 2024-04-12 ENCOUNTER — Ambulatory Visit (INDEPENDENT_AMBULATORY_CARE_PROVIDER_SITE_OTHER): Payer: Self-pay | Admitting: Family Medicine

## 2024-04-12 VITALS — BP 104/64 | HR 70 | Ht 66.0 in

## 2024-04-12 DIAGNOSIS — M9903 Segmental and somatic dysfunction of lumbar region: Secondary | ICD-10-CM

## 2024-04-12 DIAGNOSIS — M9902 Segmental and somatic dysfunction of thoracic region: Secondary | ICD-10-CM

## 2024-04-12 DIAGNOSIS — M9901 Segmental and somatic dysfunction of cervical region: Secondary | ICD-10-CM

## 2024-04-12 DIAGNOSIS — M9908 Segmental and somatic dysfunction of rib cage: Secondary | ICD-10-CM

## 2024-04-12 DIAGNOSIS — M25561 Pain in right knee: Secondary | ICD-10-CM | POA: Insufficient documentation

## 2024-04-12 DIAGNOSIS — M940 Chondrocostal junction syndrome [Tietze]: Secondary | ICD-10-CM

## 2024-04-12 DIAGNOSIS — M9904 Segmental and somatic dysfunction of sacral region: Secondary | ICD-10-CM

## 2024-04-12 NOTE — Assessment & Plan Note (Signed)
 Has more muscle improvement noted.  Discussed icing regimen and home exercises, discussed which activities increase activity slowly over the course of next several days.  Nothing severe and no changes in management at this time.  Follow-up again in 6 to 8 weeks

## 2024-04-12 NOTE — Assessment & Plan Note (Signed)
 Seems to be more muscular in nature.  I do based on the symptoms that patient patient was consider to do some home exercises but I do think we forgot to give them to her.  We can send them in my chart.  We discussed increasing activity slowly.  Follow-up again in 6 to 8 weeks

## 2024-04-12 NOTE — Patient Instructions (Signed)
See me again in 6 weeks 

## 2024-04-13 MED ORDER — TIZANIDINE HCL 2 MG PO CAPS: 2.0000 mg | ORAL_CAPSULE | Freq: Three times a day (TID) | ORAL | 0 refills | Status: AC

## 2024-04-13 MED ORDER — PANTOPRAZOLE SODIUM 40 MG PO TBEC: 40.0000 mg | DELAYED_RELEASE_TABLET | Freq: Two times a day (BID) | ORAL | 0 refills | Status: DC

## 2024-04-14 MED ORDER — PANTOPRAZOLE SODIUM 40 MG PO TBEC: 40.0000 mg | DELAYED_RELEASE_TABLET | Freq: Two times a day (BID) | ORAL | 1 refills | Status: AC

## 2024-04-14 NOTE — Addendum Note (Signed)
 Addended by: CLAUDENE ARTHEA HERO on: 04/14/2024 03:44 PM   Modules accepted: Orders

## 2024-05-03 ENCOUNTER — Encounter: Payer: Self-pay | Admitting: Family Medicine

## 2024-05-07 ENCOUNTER — Encounter: Payer: Self-pay | Admitting: Family Medicine

## 2024-05-07 ENCOUNTER — Ambulatory Visit: Payer: Self-pay | Admitting: Family Medicine

## 2024-05-07 VITALS — BP 110/60 | HR 64 | Ht 66.0 in

## 2024-05-07 DIAGNOSIS — M9901 Segmental and somatic dysfunction of cervical region: Secondary | ICD-10-CM

## 2024-05-07 DIAGNOSIS — M9903 Segmental and somatic dysfunction of lumbar region: Secondary | ICD-10-CM

## 2024-05-07 DIAGNOSIS — M9902 Segmental and somatic dysfunction of thoracic region: Secondary | ICD-10-CM

## 2024-05-07 DIAGNOSIS — M940 Chondrocostal junction syndrome [Tietze]: Secondary | ICD-10-CM

## 2024-05-07 DIAGNOSIS — M545 Low back pain, unspecified: Secondary | ICD-10-CM

## 2024-05-07 DIAGNOSIS — M9908 Segmental and somatic dysfunction of rib cage: Secondary | ICD-10-CM

## 2024-05-07 DIAGNOSIS — M9904 Segmental and somatic dysfunction of sacral region: Secondary | ICD-10-CM

## 2024-05-07 DIAGNOSIS — M999 Biomechanical lesion, unspecified: Secondary | ICD-10-CM

## 2024-05-07 DIAGNOSIS — G8929 Other chronic pain: Secondary | ICD-10-CM

## 2024-05-07 MED ORDER — TRIAMCINOLONE ACETONIDE 40 MG/ML IJ SUSP
40.0000 mg | Freq: Once | INTRAMUSCULAR | Status: AC
  Administered 2024-05-07: 40 mg via INTRAMUSCULAR

## 2024-05-07 MED ORDER — KETOROLAC TROMETHAMINE 60 MG/2ML IM SOLN
60.0000 mg | Freq: Once | INTRAMUSCULAR | Status: AC
  Administered 2024-05-07: 60 mg via INTRAMUSCULAR

## 2024-05-07 NOTE — Assessment & Plan Note (Signed)
 Chronic problem with worsening. Discussed HEp  Discussed which activities to do and which ones to avoid.  Difficulty continues to happen from time to time.  RTC in 6-8 weeks.

## 2024-05-07 NOTE — Progress Notes (Signed)
  [name] [address] [place] [address] Phone: [phone] Subjective:   [name] am a scribe for Dr. Claudene.    I'm seeing this patient by the request  of:  Patient, No Pcp Per  CC: Upper back pain follow-up  YEP:Dlagzrupcz  [name] is a 48 y.o. female coming in with complaint of back and neck pain Patient states the right rib is giving her an issue today. Back and neck are the usual.  Medications patient has been prescribed:   Taking:         Reviewed prior external information including notes and imaging from previsou exam, outside providers and external EMR if available.   As well as notes that were available from care everywhere and other healthcare systems.  Past medical history, social, surgical and family history all reviewed in electronic medical record.  No pertanent information unless stated regarding to the chief complaint.   Past Medical History:  Diagnosis Date   Allergy    Anxiety    Asthma    Chicken pox    GERD (gastroesophageal reflux disease)    IBS (irritable bowel syndrome)    Pneumonia 2015    No Known Allergies   Review of Systems:  No headache, visual changes, nausea, vomiting, diarrhea, constipation, dizziness, abdominal pain, skin rash, fevers, chills, night sweats, weight loss, swollen lymph nodes, body aches, joint swelling, chest pain, shortness of breath, mood changes. POSITIVE muscle aches  Objective  Blood pressure 110/60, pulse 64, height 5' 6 (1.676 m), SpO2 98%.   General: No apparent distress alert and oriented x3 mood and affect normal, dressed appropriately.  HEENT: Pupils equal, extraocular movements intact  Respiratory: Patient's speak in full sentences and does not appear short of breath  Cardiovascular: No lower extremity edema, non tender, no erythema  MSK:  Back does have tightness noted in the upper right side of the back. Slipped rib noted. Neg  SLT Positive FABER   Osteopathic findings  C6 flexed rotated and side bent left T3 extended rotated and side bent right inhaled rib T7 extended rotated and side bent right inhaled rib  L1 flexed rotated and side bent right Sacrum right on right     Assessment and Plan:  Slipped rib syndrome Chronic problem with worsening. Discussed HEp  Discussed which activities to do and which ones to avoid.  Difficulty continues to happen from time to time.  RTC in 6-8 weeks.   Toradol  and depomedrol given IM today   Nonallopathic problems  Decision today to treat with OMT was based on Physical Exam  After verbal consent patient was treated with HVLA, ME, FPR techniques in cervical, rib, thoracic, lumbar, and sacral  areas  Patient tolerated the procedure well with improvement in symptoms  Patient given exercises, stretches and lifestyle modifications  See medications in patient instructions if given  Patient will follow up in 4-8 weeks    The above documentation has been reviewed and is accurate and complete Arthea CHRISTELLA Claudene, DO          Note: This dictation was prepared with Dragon dictation along with smaller phrase technology. Any transcriptional errors that result from this process are unintentional.

## 2024-05-07 NOTE — Patient Instructions (Signed)
 Good to see you. Drop the resistance in your exercises.  See me as scheduled.

## 2024-05-16 NOTE — Progress Notes (Unsigned)
  [name] [address] [place] [address] Phone: [phone] Subjective:   [name] am a scribe for Dr. Claudene.   I'm seeing this patient by the request  of:  Patient, No Pcp Per  CC: Chronic back pain  YEP:Dlagzrupcz  Jaclyn [name] is a 48 y.o. female coming in with complaint of back and neck pain. OMT on 05/07/2024. Patient states it is the usual.   Medications patient has been prescribed: zanaflex  protonix   Taking:yes          Reviewed prior external information including notes and imaging from previsou exam, outside providers and external EMR if available.   As well as notes that were available from care everywhere and other healthcare systems.  Past medical history, social, surgical and family history all reviewed in electronic medical record.  No pertanent information unless stated regarding to the chief complaint.   Past Medical History:  Diagnosis Date   Allergy    Anxiety    Asthma    Chicken pox    GERD (gastroesophageal reflux disease)    IBS (irritable bowel syndrome)    Pneumonia 2015    No Known Allergies   Review of Systems:  No headache, visual changes, nausea, vomiting, diarrhea, constipation, dizziness, abdominal pain, skin rash, fevers, chills, night sweats, weight loss, swollen lymph nodes, body aches, joint swelling, chest pain, shortness of breath, mood changes. POSITIVE muscle aches  Objective  Blood pressure 110/60, pulse 68, height 5' 6 (1.676 m), SpO2 99%.   General: No apparent distress alert and oriented x3 mood and affect normal, dressed appropriately.  HEENT: Pupils equal, extraocular movements intact  Respiratory: Patient's speak in full sentences and does not appear short of breath  Cardiovascular: No lower extremity edema, non tender, no erythema  Gait MSK:  Back does have some loss of lordosis noted.  Patient does have some limited sidebending bilaterally.  Tightness with  Deri right greater than left.  Negative straight leg test. Still some tightness noted in the right parascapular area.  Osteopathic findings  C2 flexed rotated and side bent right C6 flexed rotated and side bent left T3 extended rotated and side bent right inhaled rib T9 extended rotated and side bent left L2 flexed rotated and side bent right L3 flexed rotated and side bent left Sacrum right on right     Assessment and Plan:  Slipped rib syndrome Patient was just seen 2 weeks ago and is doing relatively well.  Still had some mild tightness noted but nothing severe.  Attempted osteopathic manipulation again after further evaluation.  Discussed which activities to do in which ones to avoid.  Follow-up again in 6 to 8 weeks.    Nonallopathic problems  Decision today to treat with OMT was based on Physical Exam  After verbal consent patient was treated with HVLA, ME, FPR techniques in cervical, rib, thoracic, lumbar, and sacral  areas  Patient tolerated the procedure well with improvement in symptoms  Patient given exercises, stretches and lifestyle modifications  See medications in patient instructions if given  Patient will follow up in 4-8 weeks    The above documentation has been reviewed and is accurate and complete Faustino Luecke M Layna Roeper, DO          Note: This dictation was prepared with Dragon dictation along with smaller phrase technology. Any transcriptional errors that result from this process are unintentional.

## 2024-05-17 ENCOUNTER — Ambulatory Visit: Payer: Self-pay | Admitting: Family Medicine

## 2024-05-17 ENCOUNTER — Encounter: Payer: Self-pay | Admitting: Family Medicine

## 2024-05-17 VITALS — BP 110/60 | HR 68 | Ht 66.0 in

## 2024-05-17 DIAGNOSIS — M9903 Segmental and somatic dysfunction of lumbar region: Secondary | ICD-10-CM

## 2024-05-17 DIAGNOSIS — M9908 Segmental and somatic dysfunction of rib cage: Secondary | ICD-10-CM

## 2024-05-17 DIAGNOSIS — M9904 Segmental and somatic dysfunction of sacral region: Secondary | ICD-10-CM

## 2024-05-17 DIAGNOSIS — M9901 Segmental and somatic dysfunction of cervical region: Secondary | ICD-10-CM

## 2024-05-17 DIAGNOSIS — M940 Chondrocostal junction syndrome [Tietze]: Secondary | ICD-10-CM

## 2024-05-17 DIAGNOSIS — M9902 Segmental and somatic dysfunction of thoracic region: Secondary | ICD-10-CM

## 2024-05-17 NOTE — Assessment & Plan Note (Signed)
 Patient was just seen 2 weeks ago and is doing relatively well.  Still had some mild tightness noted but nothing severe.  Attempted osteopathic manipulation again after further evaluation.  Discussed which activities to do in which ones to avoid.  Follow-up again in 6 to 8 weeks.

## 2024-06-15 NOTE — Progress Notes (Signed)
  [name] [address] [place] [address] Phone: [phone] Subjective:   [name], am serving as a scribe for Dr. Arthea Claudene.  I'm seeing this patient by the request  of:  Patient, No Pcp Per  CC: Upper back pain follow-up  YEP:Dlagzrupcz  Jaclyn [name] is a 48 y.o. female coming in with complaint of back and neck pain. OMT on 05/17/2024. Patient states that she had COVID a few weeks ago. Nothing new with her back.   Medications patient has been prescribed:   Taking:         Reviewed prior external information including notes and imaging from previsou exam, outside providers and external EMR if available.   As well as notes that were available from care everywhere and other healthcare systems.  Past medical history, social, surgical and family history all reviewed in electronic medical record.  No pertanent information unless stated regarding to the chief complaint.   Past Medical History:  Diagnosis Date   Allergy    Anxiety    Asthma    Chicken pox    GERD (gastroesophageal reflux disease)    IBS (irritable bowel syndrome)    Pneumonia 2015    No Known Allergies   Review of Systems:  No headache, visual changes, nausea, vomiting, diarrhea, constipation, dizziness, abdominal pain, skin rash, fevers, chills, night sweats, weight loss, swollen lymph nodes, body aches, joint swelling, chest pain, shortness of breath, mood changes. POSITIVE muscle aches  Objective  Blood pressure 102/70, pulse 62, height 5' 6 (1.676 m), SpO2 98%.   General: No apparent distress alert and oriented x3 mood and affect normal, dressed appropriately.  HEENT: Pupils equal, extraocular movements intact  Respiratory: Patient's speak in full sentences and does not appear short of breath  Cardiovascular: No lower extremity edema, non tender, no erythema  Gait normal MSK:  Back tightness noted in the upper parascapular area.  Seems to be  right greater than left.  Multiple different areas all the way in the levator scapula being tight.  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  Patient will continue to work on posture and ergonomics, follow-up with me again in 6 to 8 weeks otherwise.    Nonallopathic problems  Decision today to treat with OMT was based on Physical Exam  After verbal consent patient was treated with HVLA, ME, FPR techniques in cervical, rib, thoracic, lumbar, and sacral  areas  Patient tolerated the procedure well with improvement in symptoms  Patient given exercises, stretches and lifestyle modifications  See medications in patient instructions if given  Patient will follow up in 4-8 weeks    The above documentation has been reviewed and is accurate and complete Cyndal Kasson M Evaluna Utke, DO          Note: This dictation was prepared with Dragon dictation along with smaller phrase technology. Any transcriptional errors that result from this process are unintentional.

## 2024-06-20 ENCOUNTER — Encounter: Payer: Self-pay | Admitting: Family Medicine

## 2024-06-20 ENCOUNTER — Ambulatory Visit: Payer: Self-pay | Admitting: Family Medicine

## 2024-06-20 VITALS — BP 102/70 | HR 62 | Ht 66.0 in

## 2024-06-20 DIAGNOSIS — M9901 Segmental and somatic dysfunction of cervical region: Secondary | ICD-10-CM

## 2024-06-20 DIAGNOSIS — M9904 Segmental and somatic dysfunction of sacral region: Secondary | ICD-10-CM

## 2024-06-20 DIAGNOSIS — M940 Chondrocostal junction syndrome [Tietze]: Secondary | ICD-10-CM

## 2024-06-20 DIAGNOSIS — M9908 Segmental and somatic dysfunction of rib cage: Secondary | ICD-10-CM

## 2024-06-20 DIAGNOSIS — M9902 Segmental and somatic dysfunction of thoracic region: Secondary | ICD-10-CM

## 2024-06-20 DIAGNOSIS — M9903 Segmental and somatic dysfunction of lumbar region: Secondary | ICD-10-CM

## 2024-06-20 NOTE — Patient Instructions (Signed)
See me again in 6 weeks 

## 2024-06-20 NOTE — Assessment & Plan Note (Signed)
 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  Patient will continue to work on posture and ergonomics, follow-up with me again in 6 to 8 weeks otherwise.

## 2024-07-06 ENCOUNTER — Encounter: Payer: Self-pay | Admitting: Family Medicine

## 2024-07-16 ENCOUNTER — Other Ambulatory Visit: Payer: Self-pay

## 2024-07-16 ENCOUNTER — Other Ambulatory Visit (INDEPENDENT_AMBULATORY_CARE_PROVIDER_SITE_OTHER): Payer: Self-pay

## 2024-07-16 DIAGNOSIS — R5383 Other fatigue: Secondary | ICD-10-CM

## 2024-07-16 LAB — T4, FREE: Free T4: 0.93 ng/dL (ref 0.60–1.60)

## 2024-07-16 LAB — TSH: TSH: 1.67 u[IU]/mL (ref 0.35–5.50)

## 2024-07-16 LAB — T3, FREE: T3, Free: 2.7 pg/mL (ref 2.3–4.2)

## 2024-07-17 ENCOUNTER — Ambulatory Visit: Payer: Self-pay | Admitting: Family Medicine

## 2024-07-19 LAB — T3, REVERSE: T3, Reverse: 24 ng/dL (ref 8–25)

## 2024-07-19 LAB — THYROID PEROXIDASE ANTIBODIES (TPO) (REFL): Thyroperoxidase Ab SerPl-aCnc: 99 [IU]/mL — ABNORMAL HIGH (ref <9)

## 2024-07-25 NOTE — Progress Notes (Unsigned)
 [name] [address] [place] [address] Phone: [phone] Subjective:   [name] am a scribe for Dr. Claudene.  I'm seeing this patient by the request  of:  Patient, No Pcp Per  CC: Return for blood work  YEP:Dlagzrupcz  Willer Bahe is a 48 y.o. female coming in with complaint of discuss blood work.  Patient did have labs and showed a fairly positive TPO antibody.  Needs a thyroid  scan and iodine uptake test    Past Medical History:  Diagnosis Date   Allergy    Anxiety    Asthma    Chicken pox    GERD (gastroesophageal reflux disease)    IBS (irritable bowel syndrome)    Pneumonia 2015   Past Surgical History:  Procedure Laterality Date   FOOT SURGERY     Social History   Socioeconomic History   Marital status: Legally Separated    Spouse name: Not on file   Number of children: Not on file   Years of education: Not on file   Highest education level: Not on file  Occupational History   Not on file  Tobacco Use   Smoking status: Never   Smokeless tobacco: Never  Vaping Use   Vaping status: Never Used  Substance and Sexual Activity   Alcohol use: No    Alcohol/week: 0.0 standard drinks of alcohol   Drug use: No   Sexual activity: Not on file  Other Topics Concern   Not on file  Social History Narrative   Not on file   Social Drivers of Health   Financial Resource Strain: Not on file  Food Insecurity: Not on file  Transportation Needs: Not on file  Physical Activity: Not on file  Stress: Not on file  Social Connections: Not on file   No Known Allergies Family History  Problem Relation Age of Onset   Alcohol abuse Mother    Cancer Mother    Heart disease Mother    Multiple sclerosis Mother    Alcohol abuse Father    Heart disease Father    Bladder Cancer Father    Multiple sclerosis Brother    Lung cancer Paternal Grandmother    Esophageal cancer Paternal Grandmother    Colon cancer Neg  Hx     Current Outpatient Medications (Endocrine & Metabolic):    predniSONE  (DELTASONE ) 20 MG tablet, Take 1 tablet (20 mg total) by mouth daily with breakfast.  Current Facility-Administered Medications (Endocrine & Metabolic):    methylPREDNISolone  acetate (DEPO-MEDROL ) injection 80 mg    Current Outpatient Medications (Respiratory):    beclomethasone (QVAR  REDIHALER) 80 MCG/ACT inhaler, Inhale 1 puff into the lungs 2 (two) times daily.   fluticasone (FLONASE) 50 MCG/ACT nasal spray, Place 1 spray into both nostrils daily.   Current Outpatient Medications (Analgesics):    celecoxib  (CELEBREX ) 200 MG capsule, Take 1 capsule (200 mg total) by mouth 2 (two) times daily.   colchicine  0.6 MG tablet, Take 1 tablet (0.6 mg total) by mouth 2 (two) times daily.   naproxen  (NAPROSYN ) 500 MG tablet, Take 1 tablet (500 mg total) by mouth 2 (two) times daily with a meal.   Current Outpatient Medications (Hematological):    vitamin B-12 (CYANOCOBALAMIN ) 250 MCG tablet, Take 250 mcg by mouth daily.   Current Outpatient Medications (Other):    diazepam  (VALIUM ) 5 MG tablet, One tab by mouth, 2 hours before procedure.   DULoxetine  (CYMBALTA ) 20 MG capsule, Take 1 capsule (  20 mg total) by mouth daily.   esomeprazole (NEXIUM) 40 MG capsule, Take 40 mg by mouth 2 (two) times daily.   hydrOXYzine  (ATARAX ) 10 MG tablet, Take 1 tablet (10 mg total) by mouth 3 (three) times daily as needed.   MAGNESIUM CITRATE PO*, Take by mouth daily. 3/4 tsp in 1 liter of water   pantoprazole  (PROTONIX ) 40 MG tablet, Take 1 tablet (40 mg total) by mouth 2 (two) times daily.   tizanidine  (ZANAFLEX ) 2 MG capsule, Take 1 capsule (2 mg total) by mouth 3 (three) times daily.   Vitamin D , Ergocalciferol , (DRISDOL ) 1.25 MG (50000 UNIT) CAPS capsule, Take 1 capsule (50,000 Units total) by mouth every 7 (seven) days.  * These medications belong to multiple therapeutic classes and are listed under each applicable  group.   Reviewed prior external information including notes and imaging from  primary care provider As well as notes that were available from care everywhere and other healthcare systems.  Past medical history, social, surgical and family history all reviewed in electronic medical record.  No pertanent information unless stated regarding to the chief complaint.   Review of Systems:  No headache, visual changes, nausea, vomiting, diarrhea, constipation, dizziness, abdominal pain, skin rash, fevers, chills, night sweats, weight loss, swollen lymph nodes, , joint swelling, chest pain, shortness of breath, mood changes. POSITIVE muscle aches, body aches and fatigue  Objective  Blood pressure 98/60, pulse 72, height 5' 6 (1.676 m), SpO2 96%.   General: No apparent distress alert and oriented x3 mood and affect normal, dressed appropriately.  HEENT: Pupils equal, extraocular movements intact  Respiratory: Patient's speak in full sentences and does not appear short of breath  Cardiovascular: No lower extremity edema, non tender, no erythema  Patient does seem to be comfortable today sitting.  Deferred the rest of the exam    Impression and Recommendations:     The above documentation has been reviewed and is accurate and complete Jesalyn Finazzo M Wilmina Maxham, DO

## 2024-07-26 ENCOUNTER — Ambulatory Visit: Payer: Self-pay | Admitting: Family Medicine

## 2024-07-26 VITALS — BP 98/60 | HR 72 | Ht 66.0 in

## 2024-07-26 DIAGNOSIS — R7689 Other specified abnormal immunological findings in serum: Secondary | ICD-10-CM

## 2024-07-26 DIAGNOSIS — E079 Disorder of thyroid, unspecified: Secondary | ICD-10-CM

## 2024-07-26 NOTE — Patient Instructions (Addendum)
 Good to see you. Thyroid  Scan in [place] See me again in 6 weeks

## 2024-07-26 NOTE — Assessment & Plan Note (Signed)
 Significant at this time.  Will get a radial nuclear uptake scan done to further evaluate the autoimmune crpe abilities of this thyroid .  Depending on findings then would discuss the possible need for intervention with either general surgery or endocrinology.  Do not think medication changes are necessary yet at this time.  Patient knows if any signs and symptoms of any type of thyroid  storm to seek medical attention immediately.

## 2024-08-28 NOTE — Progress Notes (Unsigned)
 [name] [address] [place] [address] Phone: [phone] Subjective:   [name], am serving as a scribe for Dr. Arthea Claudene.  I'm seeing this patient by the request  of:  Patient, No Pcp Per  CC: Back pain  YEP:Dlagzrupcz  07/26/2024 Significant at this time.  Will get a radial nuclear uptake scan done to further evaluate the autoimmune crpe abilities of this thyroid .  Depending on findings then would discuss the possible need for intervention with either general surgery or endocrinology.  Do not think medication changes are necessary yet at this time.  Patient knows if any signs and symptoms of any type of thyroid  storm to seek medical attention immediately.    OMT in August  Updated 08/30/2024 Jaclyn [name] is a 48 y.o. female coming in with complaint of back pain. She is tight and ready for adjustment.  Still seems to be in the upper back more in the right parascapular area.     Past Medical History:  Diagnosis Date   Allergy    Anxiety    Asthma    Chicken pox    GERD (gastroesophageal reflux disease)    IBS (irritable bowel syndrome)    Pneumonia 2015   Past Surgical History:  Procedure Laterality Date   FOOT SURGERY     Social History   Socioeconomic History   Marital status: Legally Separated    Spouse name: Not on file   Number of children: Not on file   Years of education: Not on file   Highest education level: Not on file  Occupational History   Not on file  Tobacco Use   Smoking status: Never   Smokeless tobacco: Never  Vaping Use   Vaping status: Never Used  Substance and Sexual Activity   Alcohol use: No    Alcohol/week: 0.0 standard drinks of alcohol   Drug use: No   Sexual activity: Not on file  Other Topics Concern   Not on file  Social History Narrative   Not on file   Social Drivers of Health   Financial Resource Strain: Not on file  Food Insecurity: Not on file  Transportation  Needs: Not on file  Physical Activity: Not on file  Stress: Not on file  Social Connections: Not on file   No Known Allergies Family History  Problem Relation Age of Onset   Alcohol abuse Mother    Cancer Mother    Heart disease Mother    Multiple sclerosis Mother    Alcohol abuse Father    Heart disease Father    Bladder Cancer Father    Multiple sclerosis Brother    Lung cancer Paternal Grandmother    Esophageal cancer Paternal Grandmother    Colon cancer Neg Hx     Current Outpatient Medications (Endocrine & Metabolic):    predniSONE  (DELTASONE ) 20 MG tablet, Take 1 tablet (20 mg total) by mouth daily with breakfast.  Current Facility-Administered Medications (Endocrine & Metabolic):    methylPREDNISolone  acetate (DEPO-MEDROL ) injection 80 mg    Current Outpatient Medications (Respiratory):    beclomethasone (QVAR  REDIHALER) 80 MCG/ACT inhaler, Inhale 1 puff into the lungs 2 (two) times daily.   fluticasone (FLONASE) 50 MCG/ACT nasal spray, Place 1 spray into both nostrils daily.   Current Outpatient Medications (Analgesics):    celecoxib  (CELEBREX ) 200 MG capsule, Take 1 capsule (200 mg total) by mouth 2 (two) times daily.   colchicine  0.6 MG tablet, Take 1 tablet (  0.6 mg total) by mouth 2 (two) times daily.   naproxen  (NAPROSYN ) 500 MG tablet, Take 1 tablet (500 mg total) by mouth 2 (two) times daily with a meal.   Current Outpatient Medications (Hematological):    vitamin B-12 (CYANOCOBALAMIN ) 250 MCG tablet, Take 250 mcg by mouth daily.   Current Outpatient Medications (Other):    diazepam  (VALIUM ) 5 MG tablet, One tab by mouth, 2 hours before procedure.   DULoxetine  (CYMBALTA ) 20 MG capsule, Take 1 capsule (20 mg total) by mouth daily.   esomeprazole (NEXIUM) 40 MG capsule, Take 40 mg by mouth 2 (two) times daily.   hydrOXYzine  (ATARAX ) 10 MG tablet, Take 1 tablet (10 mg total) by mouth 3 (three) times daily as needed.   MAGNESIUM CITRATE PO*, Take by mouth  daily. 3/4 tsp in 1 liter of water   pantoprazole  (PROTONIX ) 40 MG tablet, Take 1 tablet (40 mg total) by mouth 2 (two) times daily.   tizanidine  (ZANAFLEX ) 2 MG capsule, Take 1 capsule (2 mg total) by mouth 3 (three) times daily.   Vitamin D , Ergocalciferol , (DRISDOL ) 1.25 MG (50000 UNIT) CAPS capsule, Take 1 capsule (50,000 Units total) by mouth every 7 (seven) days.  * These medications belong to multiple therapeutic classes and are listed under each applicable group.   Reviewed prior external information including notes and imaging from  primary care provider As well as notes that were available from care everywhere and other healthcare systems.  Past medical history, social, surgical and family history all reviewed in electronic medical record.  No pertanent information unless stated regarding to the chief complaint.   Review of Systems:  No headache, visual changes, nausea, vomiting, diarrhea, constipation, dizziness, abdominal pain, skin rash, fevers, chills, night sweats, weight loss, swollen lymph nodes, body aches, joint swelling, chest pain, shortness of breath, mood changes. POSITIVE muscle aches  Objective  Blood pressure 104/78, pulse 65, height 5' 6 (1.676 m), SpO2 98%.   General: No apparent distress alert and oriented x3 mood and affect normal, dressed appropriately.  HEENT: Pupils equal, extraocular movements intact  Respiratory: Patient's speak in full sentences and does not appear short of breath  Cardiovascular: No lower extremity edema, non tender, no erythema  Back exam does have some mild loss lordosis.  Significant tightness noted in the parascapular area right greater than left.   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  Can cause more discomfort and pain.  Discussed icing regimen and home exercises, discussed which activities to do and which ones to avoid.  Increase activity slowly.  Discussed icing regimen.   Decision today to treat with OMT was based on Physical Exam  After verbal consent patient was treated with HVLA, ME, FPR techniques in cervical, thoracic, rib, lumbar and sacral areas, all areas are chronic   Patient tolerated the procedure well with improvement in symptoms  Patient given exercises, stretches and lifestyle modifications  See medications in patient instructions if given  Patient will follow up in 6-8 weeks  The above documentation has been reviewed and is accurate and complete Jaclyn [name] M Jaclyn Pettus, DO

## 2024-08-30 ENCOUNTER — Ambulatory Visit: Payer: Self-pay | Admitting: Family Medicine

## 2024-08-30 ENCOUNTER — Encounter: Payer: Self-pay | Admitting: Family Medicine

## 2024-08-30 VITALS — BP 104/78 | HR 65 | Ht 66.0 in

## 2024-08-30 DIAGNOSIS — M9904 Segmental and somatic dysfunction of sacral region: Secondary | ICD-10-CM

## 2024-08-30 DIAGNOSIS — M9903 Segmental and somatic dysfunction of lumbar region: Secondary | ICD-10-CM

## 2024-08-30 DIAGNOSIS — M940 Chondrocostal junction syndrome [Tietze]: Secondary | ICD-10-CM

## 2024-08-30 DIAGNOSIS — M9902 Segmental and somatic dysfunction of thoracic region: Secondary | ICD-10-CM

## 2024-08-30 DIAGNOSIS — M9908 Segmental and somatic dysfunction of rib cage: Secondary | ICD-10-CM

## 2024-08-30 DIAGNOSIS — M9901 Segmental and somatic dysfunction of cervical region: Secondary | ICD-10-CM

## 2024-08-30 NOTE — Assessment & Plan Note (Signed)
 Continues to have difficulty, do think that there is significant cartilage difficulty noted.  Can cause more discomfort and pain.  Discussed icing regimen and home exercises, discussed which activities to do and which ones to avoid.  Increase activity slowly.  Discussed icing regimen.

## 2024-08-31 ENCOUNTER — Encounter: Payer: Self-pay | Admitting: Family Medicine

## 2024-09-13 ENCOUNTER — Encounter: Payer: Self-pay | Admitting: Family Medicine

## 2024-09-14 ENCOUNTER — Other Ambulatory Visit: Payer: Self-pay

## 2024-10-02 NOTE — Progress Notes (Unsigned)
  [name] [address] [place] [address] Phone: [phone] Subjective:    I'm seeing this patient by the request  of:  Patient, No Pcp Per  CC:   YEP:Dlagzrupcz  Jaclyn [name] is a 48 y.o. female coming in with complaint of back and neck pain. OMT on 08/30/2024. Patient states   Medications patient has been prescribed:   Taking:         Reviewed prior external information including notes and imaging from previsou exam, outside providers and external EMR if available.   As well as notes that were available from care everywhere and other healthcare systems.  Past medical history, social, surgical and family history all reviewed in electronic medical record.  No pertanent information unless stated regarding to the chief complaint.   Past Medical History:  Diagnosis Date   Allergy    Anxiety    Asthma    Chicken pox    GERD (gastroesophageal reflux disease)    IBS (irritable bowel syndrome)    Pneumonia 2015    No Known Allergies   Review of Systems:  No headache, visual changes, nausea, vomiting, diarrhea, constipation, dizziness, abdominal pain, skin rash, fevers, chills, night sweats, weight loss, swollen lymph nodes, body aches, joint swelling, chest pain, shortness of breath, mood changes. POSITIVE muscle aches  Objective  There were no vitals taken for this visit.   General: No apparent distress alert and oriented x3 mood and affect normal, dressed appropriately.  HEENT: Pupils equal, extraocular movements intact  Respiratory: Patient's speak in full sentences and does not appear short of breath  Cardiovascular: No lower extremity edema, non tender, no erythema  Gait MSK:  Back   Osteopathic findings  C2 flexed rotated and side bent right C6 flexed rotated and side bent left T3 extended rotated and side bent right inhaled rib T9 extended rotated and side bent left L2 flexed rotated and side bent right Sacrum  right on right       Assessment and Plan:  No problem-specific Assessment & Plan notes found for this encounter.    Nonallopathic problems  Decision today to treat with OMT was based on Physical Exam  After verbal consent patient was treated with HVLA, ME, FPR techniques in cervical, rib, thoracic, lumbar, and sacral  areas  Patient tolerated the procedure well with improvement in symptoms  Patient given exercises, stretches and lifestyle modifications  See medications in patient instructions if given  Patient will follow up in 4-8 weeks             Note: This dictation was prepared with Dragon dictation along with smaller phrase technology. Any transcriptional errors that result from this process are unintentional.

## 2024-10-04 ENCOUNTER — Encounter: Payer: Self-pay | Admitting: Family Medicine

## 2024-10-04 ENCOUNTER — Ambulatory Visit: Payer: Self-pay | Admitting: Family Medicine

## 2024-10-04 ENCOUNTER — Other Ambulatory Visit: Payer: Self-pay

## 2024-10-04 VITALS — BP 102/72 | HR 71 | Ht 66.0 in

## 2024-10-04 DIAGNOSIS — M9901 Segmental and somatic dysfunction of cervical region: Secondary | ICD-10-CM

## 2024-10-04 DIAGNOSIS — M25532 Pain in left wrist: Secondary | ICD-10-CM

## 2024-10-04 DIAGNOSIS — M940 Chondrocostal junction syndrome [Tietze]: Secondary | ICD-10-CM

## 2024-10-04 DIAGNOSIS — G5602 Carpal tunnel syndrome, left upper limb: Secondary | ICD-10-CM

## 2024-10-04 DIAGNOSIS — M9903 Segmental and somatic dysfunction of lumbar region: Secondary | ICD-10-CM

## 2024-10-04 DIAGNOSIS — M9904 Segmental and somatic dysfunction of sacral region: Secondary | ICD-10-CM

## 2024-10-04 DIAGNOSIS — M9908 Segmental and somatic dysfunction of rib cage: Secondary | ICD-10-CM

## 2024-10-04 DIAGNOSIS — M9902 Segmental and somatic dysfunction of thoracic region: Secondary | ICD-10-CM

## 2024-10-04 NOTE — Patient Instructions (Signed)
 Great to see you all See me in 6-8 weeks

## 2024-10-04 NOTE — Assessment & Plan Note (Signed)
 Patient given injection and tolerated procedure well.  Increase activity as tolerated.  If continuing to have difficulty possible nerve conduction study could be beneficial.  Follow-up again in 6 to 8 weeks.

## 2024-10-04 NOTE — Assessment & Plan Note (Signed)
 Chronic problem with exacerbation.  Has a hypermobility that likely contributes.  Discussed which activities to do and which ones to avoid.  Increase activity slowly.  Discussed home exercises.  Follow-up again in 6 to 8 weeks.

## 2024-11-06 NOTE — Progress Notes (Unsigned)
" °  [name] [address] [place] [address] Phone: [phone] Subjective:   Jaclyn [name], am serving as a scribe for Dr. Arthea Claudene.  I'm seeing this patient by the request  of:  Patient, No Pcp Per  CC: Back and neck pain follow-up  YEP:Dlagzrupcz  Jaclyn [name] is a 49 y.o. female coming in with complaint of back and neck pain. OMT on 10/04/2024. Patient states that she is having R adductor pain, R foot, R and L ribs are out.    Medications patient has been prescribed:   Taking:         Reviewed prior external information including notes and imaging from previsou exam, outside providers and external EMR if available.   As well as notes that were available from care everywhere and other healthcare systems.  Past medical history, social, surgical and family history all reviewed in electronic medical record.  No pertanent information unless stated regarding to the chief complaint.   Past Medical History:  Diagnosis Date   Allergy    Anxiety    Asthma    Chicken pox    GERD (gastroesophageal reflux disease)    IBS (irritable bowel syndrome)    Pneumonia 2015    Allergies[1]   Review of Systems:  No headache, visual changes, nausea, vomiting, diarrhea, constipation, dizziness, abdominal pain, skin rash, fevers, chills, night sweats, weight loss, swollen lymph nodes, body aches, joint swelling, chest pain, shortness of breath, mood changes. POSITIVE muscle aches  Objective  Blood pressure 102/68, pulse 60, height 5' 6 (1.676 m), SpO2 100%.   General: No apparent distress alert and oriented x3 mood and affect normal, dressed appropriately.  HEENT: Pupils equal, extraocular movements intact  Respiratory: Patient's speak in full sentences and does not appear short of breath  Cardiovascular: No lower extremity edema, non tender, no erythema  Gait MSK:  Back does have some loss of lordosis.  Some mild hypermobility noted  in other joints.  Tightness noted in the right parascapular area.  Some tightness noted in the groin area on the right side more than usual.  Great range of motion no of the hip.  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  Discussed which activities to do and which ones to avoid.  Increase activity slowly.  Discussed icing regimen.  Follow-up again in 6 to 12 weeks.    Nonallopathic problems  Decision today to treat with OMT was based on Physical Exam  After verbal consent patient was treated with HVLA, ME, FPR techniques in cervical, rib, thoracic, lumbar, and sacral  areas  Patient tolerated the procedure well with improvement in symptoms  Patient given exercises, stretches and lifestyle modifications  See medications in patient instructions if given  Patient will follow up in 4-8 weeks      The above documentation has been reviewed and is accurate and complete Kadisha Goodine M Tegan Burnside, DO        Note: This dictation was prepared with Dragon dictation along with smaller phrase technology. Any transcriptional errors that result from this process are unintentional.            [1] No Known Allergies  "

## 2024-11-08 ENCOUNTER — Encounter: Payer: Self-pay | Admitting: Family Medicine

## 2024-11-08 ENCOUNTER — Ambulatory Visit: Payer: Self-pay | Admitting: Family Medicine

## 2024-11-08 VITALS — BP 102/68 | HR 60 | Ht 66.0 in

## 2024-11-08 DIAGNOSIS — M9901 Segmental and somatic dysfunction of cervical region: Secondary | ICD-10-CM

## 2024-11-08 DIAGNOSIS — M9904 Segmental and somatic dysfunction of sacral region: Secondary | ICD-10-CM

## 2024-11-08 DIAGNOSIS — M940 Chondrocostal junction syndrome [Tietze]: Secondary | ICD-10-CM

## 2024-11-08 DIAGNOSIS — M9908 Segmental and somatic dysfunction of rib cage: Secondary | ICD-10-CM

## 2024-11-08 DIAGNOSIS — M9902 Segmental and somatic dysfunction of thoracic region: Secondary | ICD-10-CM

## 2024-11-08 DIAGNOSIS — M9903 Segmental and somatic dysfunction of lumbar region: Secondary | ICD-10-CM

## 2024-11-08 NOTE — Assessment & Plan Note (Signed)
 Chronic problem with exacerbation.  Discussed which activities to do and which ones to avoid.  Increase activity slowly.  Discussed icing regimen.  Follow-up again in 6 to 12 weeks.

## 2024-11-08 NOTE — Patient Instructions (Signed)
 Good to see you Thigh compression with working out See me in 6-8 weeks

## 2024-12-13 ENCOUNTER — Ambulatory Visit: Payer: Self-pay | Admitting: Family Medicine

## 2025-01-17 ENCOUNTER — Ambulatory Visit: Payer: Self-pay | Admitting: Family Medicine
